# Patient Record
Sex: Female | Born: 1945
Health system: Southern US, Community
[De-identification: ages and names within clinical notes are randomized; demographics above are authoritative.]

## PROBLEM LIST (undated history)

## (undated) DIAGNOSIS — E079 Disorder of thyroid, unspecified: Secondary | ICD-10-CM

## (undated) HISTORY — PX: BREAST SURGERY: SHX581

## (undated) HISTORY — DX: Disorder of thyroid, unspecified: E07.9

## (undated) HISTORY — PX: BREAST EXCISIONAL BIOPSY: SUR124

## (undated) HISTORY — PX: TUBAL LIGATION: SHX77

## (undated) HISTORY — PX: OTHER SURGICAL HISTORY: SHX169

## (undated) HISTORY — PX: CATARACT EXTRACTION, BILATERAL: SHX1313

---

## 1998-06-26 ENCOUNTER — Other Ambulatory Visit: Admission: RE | Admit: 1998-06-26 | Discharge: 1998-06-26 | Payer: Self-pay | Admitting: Gynecology

## 1999-07-01 ENCOUNTER — Other Ambulatory Visit: Admission: RE | Admit: 1999-07-01 | Discharge: 1999-07-01 | Payer: Self-pay | Admitting: Gynecology

## 2000-06-25 ENCOUNTER — Other Ambulatory Visit: Admission: RE | Admit: 2000-06-25 | Discharge: 2000-06-25 | Payer: Self-pay | Admitting: Gynecology

## 2001-07-01 ENCOUNTER — Other Ambulatory Visit: Admission: RE | Admit: 2001-07-01 | Discharge: 2001-07-01 | Payer: Self-pay | Admitting: Gynecology

## 2002-07-05 ENCOUNTER — Other Ambulatory Visit: Admission: RE | Admit: 2002-07-05 | Discharge: 2002-07-05 | Payer: Self-pay | Admitting: Gynecology

## 2002-09-20 ENCOUNTER — Encounter: Admission: RE | Admit: 2002-09-20 | Discharge: 2002-09-20 | Payer: Self-pay | Admitting: Family Medicine

## 2002-09-20 ENCOUNTER — Encounter: Payer: Self-pay | Admitting: Family Medicine

## 2003-07-03 ENCOUNTER — Other Ambulatory Visit: Admission: RE | Admit: 2003-07-03 | Discharge: 2003-07-03 | Payer: Self-pay | Admitting: Gynecology

## 2004-06-28 ENCOUNTER — Ambulatory Visit: Payer: Self-pay | Admitting: Family Medicine

## 2004-07-01 ENCOUNTER — Ambulatory Visit: Payer: Self-pay | Admitting: Family Medicine

## 2004-07-04 ENCOUNTER — Other Ambulatory Visit: Admission: RE | Admit: 2004-07-04 | Discharge: 2004-07-04 | Payer: Self-pay | Admitting: Gynecology

## 2005-07-03 ENCOUNTER — Other Ambulatory Visit: Admission: RE | Admit: 2005-07-03 | Discharge: 2005-07-03 | Payer: Self-pay | Admitting: Gynecology

## 2005-07-04 ENCOUNTER — Other Ambulatory Visit: Admission: RE | Admit: 2005-07-04 | Discharge: 2005-07-04 | Payer: Self-pay | Admitting: Gynecology

## 2005-08-22 ENCOUNTER — Ambulatory Visit: Payer: Self-pay | Admitting: Family Medicine

## 2005-09-04 ENCOUNTER — Ambulatory Visit: Payer: Self-pay | Admitting: Family Medicine

## 2005-12-01 ENCOUNTER — Ambulatory Visit: Payer: Self-pay | Admitting: Internal Medicine

## 2006-03-06 ENCOUNTER — Ambulatory Visit: Payer: Self-pay | Admitting: Family Medicine

## 2006-03-09 ENCOUNTER — Encounter: Admission: RE | Admit: 2006-03-09 | Discharge: 2006-03-09 | Payer: Self-pay | Admitting: Family Medicine

## 2006-03-11 ENCOUNTER — Ambulatory Visit: Payer: Self-pay | Admitting: Family Medicine

## 2006-08-26 ENCOUNTER — Ambulatory Visit: Payer: Self-pay | Admitting: Family Medicine

## 2006-12-17 ENCOUNTER — Ambulatory Visit: Payer: Self-pay | Admitting: Family Medicine

## 2006-12-17 LAB — CONVERTED CEMR LAB
Albumin: 3.7 g/dL (ref 3.5–5.2)
Basophils Absolute: 0 10*3/uL (ref 0.0–0.1)
Bilirubin, Direct: 0.1 mg/dL (ref 0.0–0.3)
Cholesterol: 198 mg/dL (ref 0–200)
Eosinophils Absolute: 0.1 10*3/uL (ref 0.0–0.6)
Eosinophils Relative: 1 % (ref 0.0–5.0)
GFR calc Af Amer: 110 mL/min
GFR calc non Af Amer: 91 mL/min
Glucose, Bld: 98 mg/dL (ref 70–99)
HCT: 36.1 % (ref 36.0–46.0)
Lymphocytes Relative: 25.7 % (ref 12.0–46.0)
MCHC: 34.7 g/dL (ref 30.0–36.0)
MCV: 88.7 fL (ref 78.0–100.0)
Neutro Abs: 3.7 10*3/uL (ref 1.4–7.7)
Neutrophils Relative %: 63.3 % (ref 43.0–77.0)
Platelets: 272 10*3/uL (ref 150–400)
Sodium: 144 meq/L (ref 135–145)
TSH: 0.86 microintl units/mL (ref 0.35–5.50)
Total CHOL/HDL Ratio: 4.4
Triglycerides: 109 mg/dL (ref 0–149)
WBC: 5.9 10*3/uL (ref 4.5–10.5)

## 2006-12-24 ENCOUNTER — Ambulatory Visit: Payer: Self-pay | Admitting: Family Medicine

## 2007-03-25 ENCOUNTER — Telehealth: Payer: Self-pay | Admitting: Family Medicine

## 2007-04-14 DIAGNOSIS — J309 Allergic rhinitis, unspecified: Secondary | ICD-10-CM | POA: Insufficient documentation

## 2007-04-14 DIAGNOSIS — E039 Hypothyroidism, unspecified: Secondary | ICD-10-CM | POA: Insufficient documentation

## 2007-04-14 DIAGNOSIS — M810 Age-related osteoporosis without current pathological fracture: Secondary | ICD-10-CM | POA: Insufficient documentation

## 2007-10-07 ENCOUNTER — Telehealth (INDEPENDENT_AMBULATORY_CARE_PROVIDER_SITE_OTHER): Payer: Self-pay | Admitting: *Deleted

## 2008-01-13 ENCOUNTER — Encounter: Payer: Self-pay | Admitting: *Deleted

## 2008-03-03 ENCOUNTER — Telehealth: Payer: Self-pay | Admitting: Internal Medicine

## 2008-03-09 ENCOUNTER — Telehealth: Payer: Self-pay | Admitting: Internal Medicine

## 2008-03-09 ENCOUNTER — Encounter: Payer: Self-pay | Admitting: Internal Medicine

## 2008-04-07 ENCOUNTER — Ambulatory Visit: Payer: Self-pay | Admitting: Family Medicine

## 2008-04-07 LAB — CONVERTED CEMR LAB
Albumin: 3.8 g/dL (ref 3.5–5.2)
Alkaline Phosphatase: 83 units/L (ref 39–117)
BUN: 17 mg/dL (ref 6–23)
Basophils Absolute: 0 10*3/uL (ref 0.0–0.1)
Bilirubin Urine: NEGATIVE
Cholesterol: 220 mg/dL (ref 0–200)
Creatinine, Ser: 0.7 mg/dL (ref 0.4–1.2)
Direct LDL: 153.3 mg/dL
Eosinophils Absolute: 0 10*3/uL (ref 0.0–0.7)
Eosinophils Relative: 0.3 % (ref 0.0–5.0)
GFR calc Af Amer: 110 mL/min
GFR calc non Af Amer: 91 mL/min
Glucose, Urine, Semiquant: NEGATIVE
HCT: 38.6 % (ref 36.0–46.0)
Ketones, urine, test strip: NEGATIVE
MCHC: 33.8 g/dL (ref 30.0–36.0)
MCV: 92.6 fL (ref 78.0–100.0)
Monocytes Absolute: 1.1 10*3/uL — ABNORMAL HIGH (ref 0.1–1.0)
Platelets: 317 10*3/uL (ref 150–400)
Potassium: 4.3 meq/L (ref 3.5–5.1)
RDW: 12.3 % (ref 11.5–14.6)
Specific Gravity, Urine: 1.015
Triglycerides: 213 mg/dL (ref 0–149)
WBC: 10.7 10*3/uL — ABNORMAL HIGH (ref 4.5–10.5)
pH: 6

## 2008-04-14 ENCOUNTER — Ambulatory Visit: Payer: Self-pay | Admitting: Family Medicine

## 2008-04-14 DIAGNOSIS — K2100 Gastro-esophageal reflux disease with esophagitis, without bleeding: Secondary | ICD-10-CM | POA: Insufficient documentation

## 2008-08-14 ENCOUNTER — Emergency Department (HOSPITAL_COMMUNITY): Admission: EM | Admit: 2008-08-14 | Discharge: 2008-08-14 | Payer: Self-pay | Admitting: Obstetrics and Gynecology

## 2008-08-14 DIAGNOSIS — T07XXXA Unspecified multiple injuries, initial encounter: Secondary | ICD-10-CM | POA: Insufficient documentation

## 2008-08-14 IMAGING — CR DG RIBS W/ CHEST 3+V*L*
4 series · 4 of 4 positions shown · non-contrast
Comparison: None

CLINICAL DATA: Fall.  Pain.

LEFT RIBS AND CHEST - 3+ VIEW

[w chest pa]
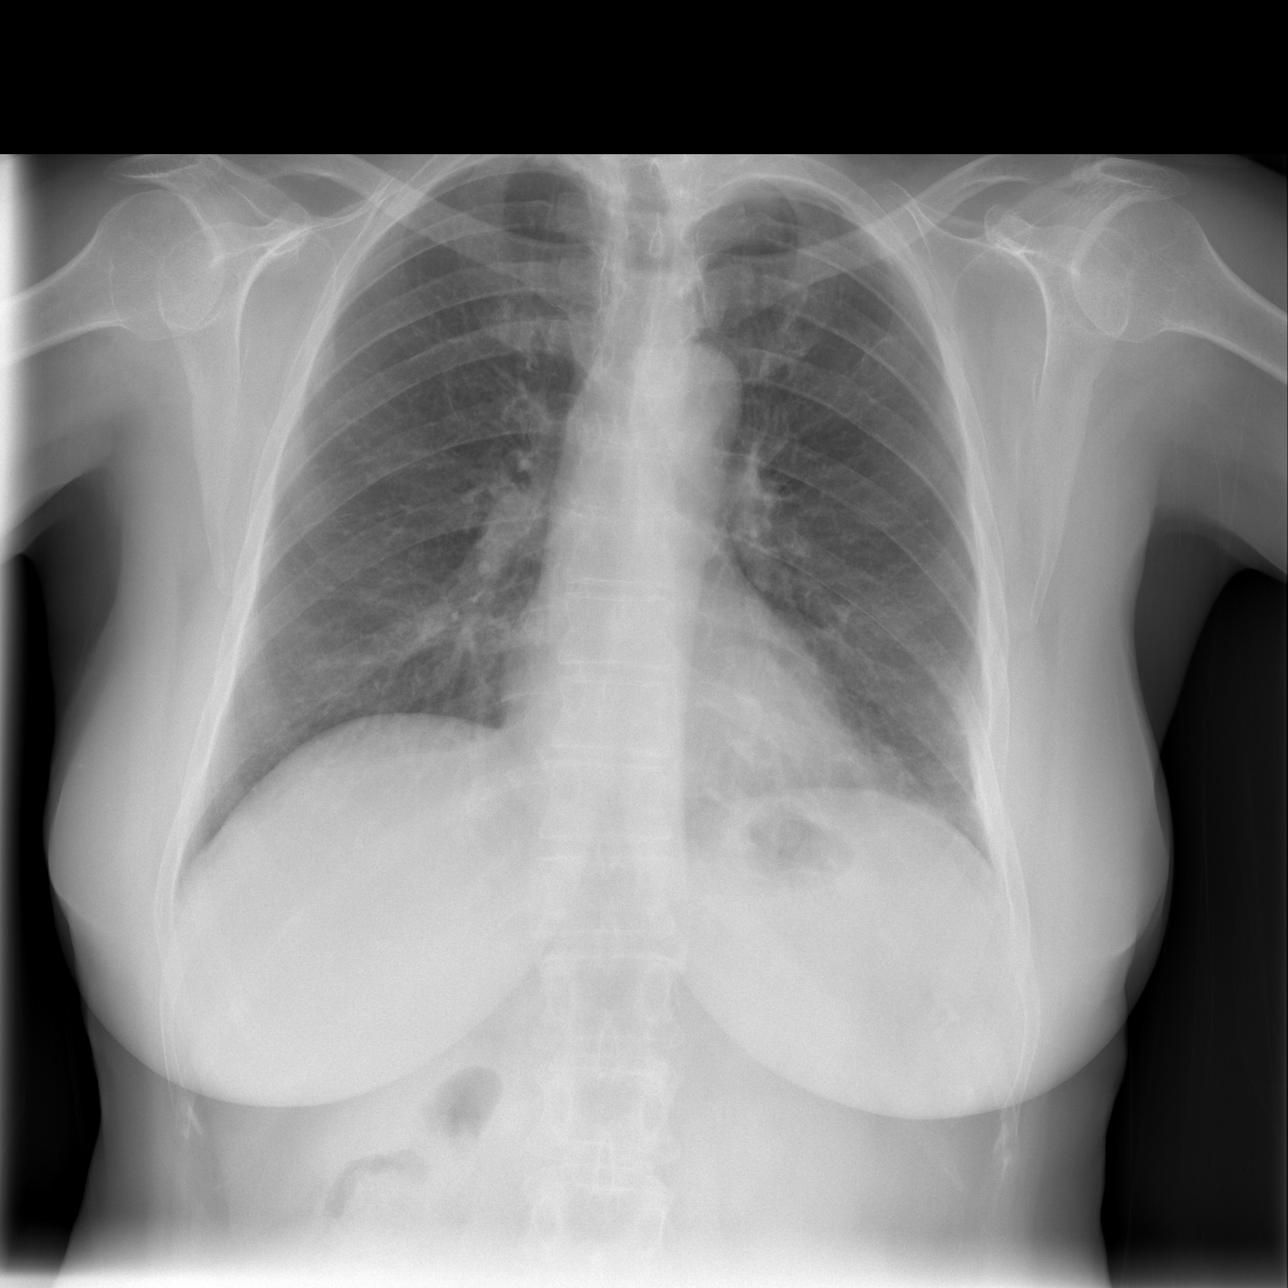

[w ribs ap/pa upper left *]
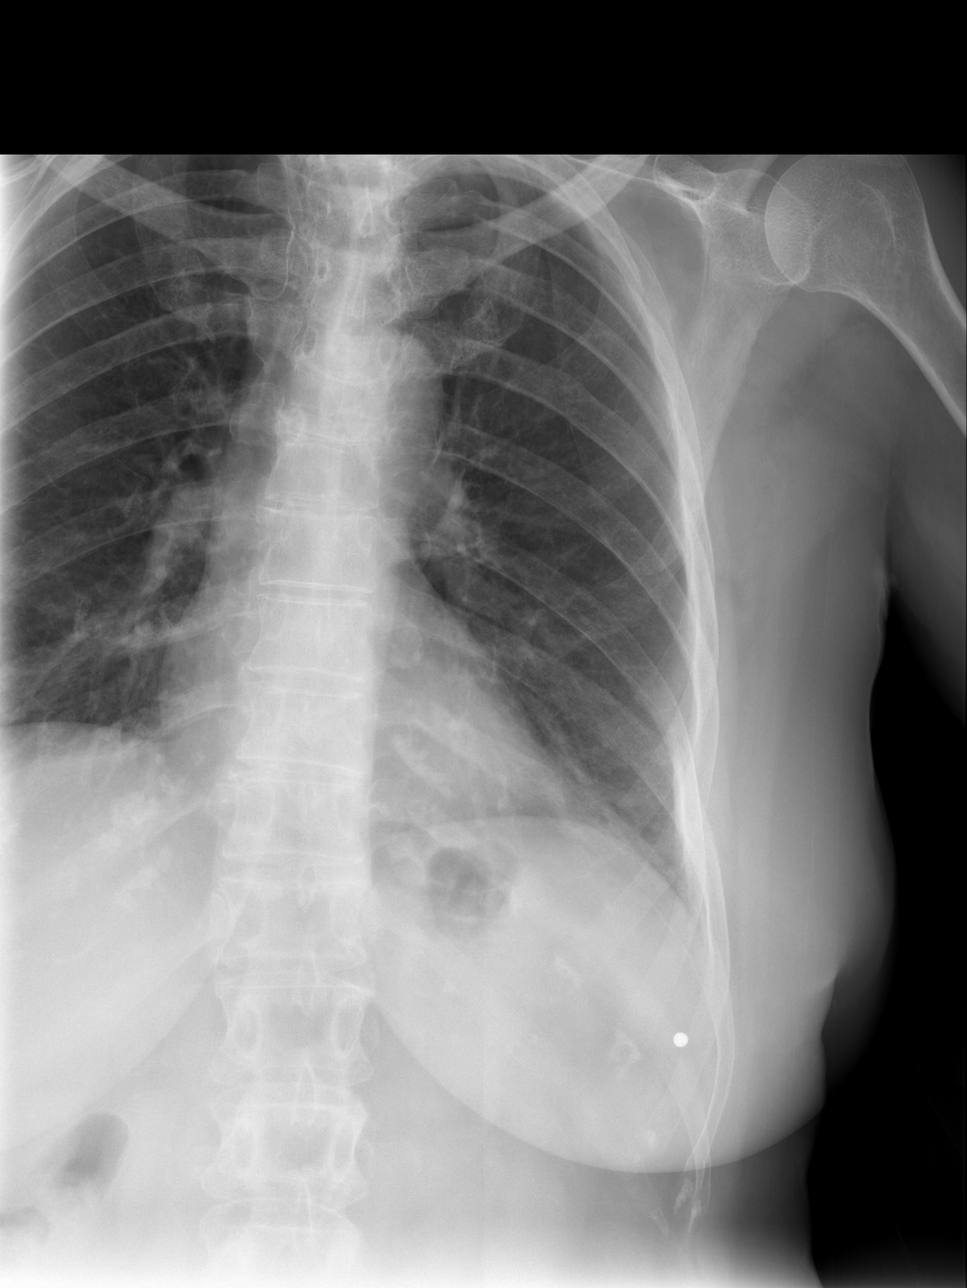

[w ribs ap/pa lower left *]
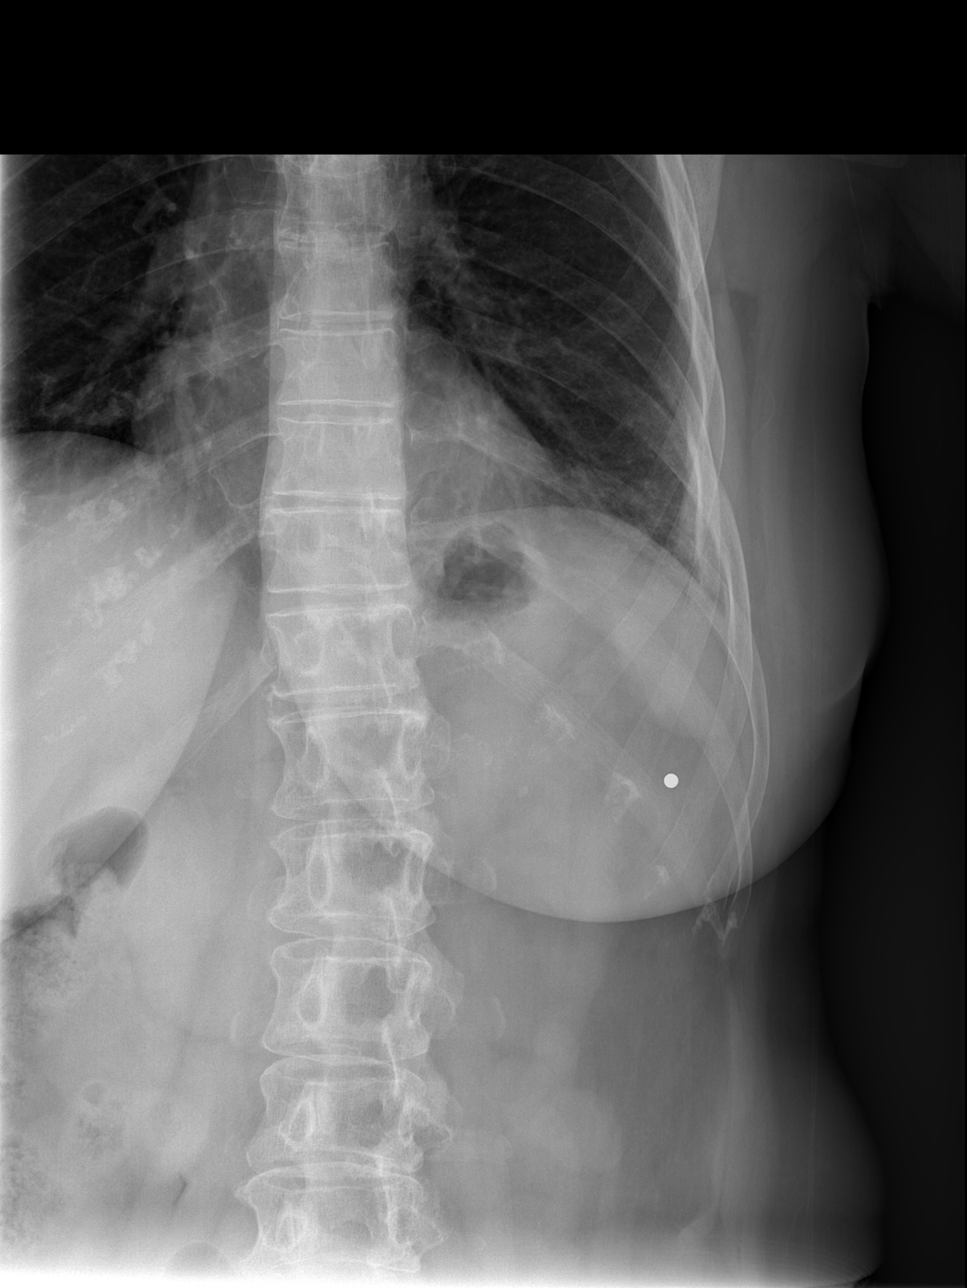

[w ribs oblique left *]
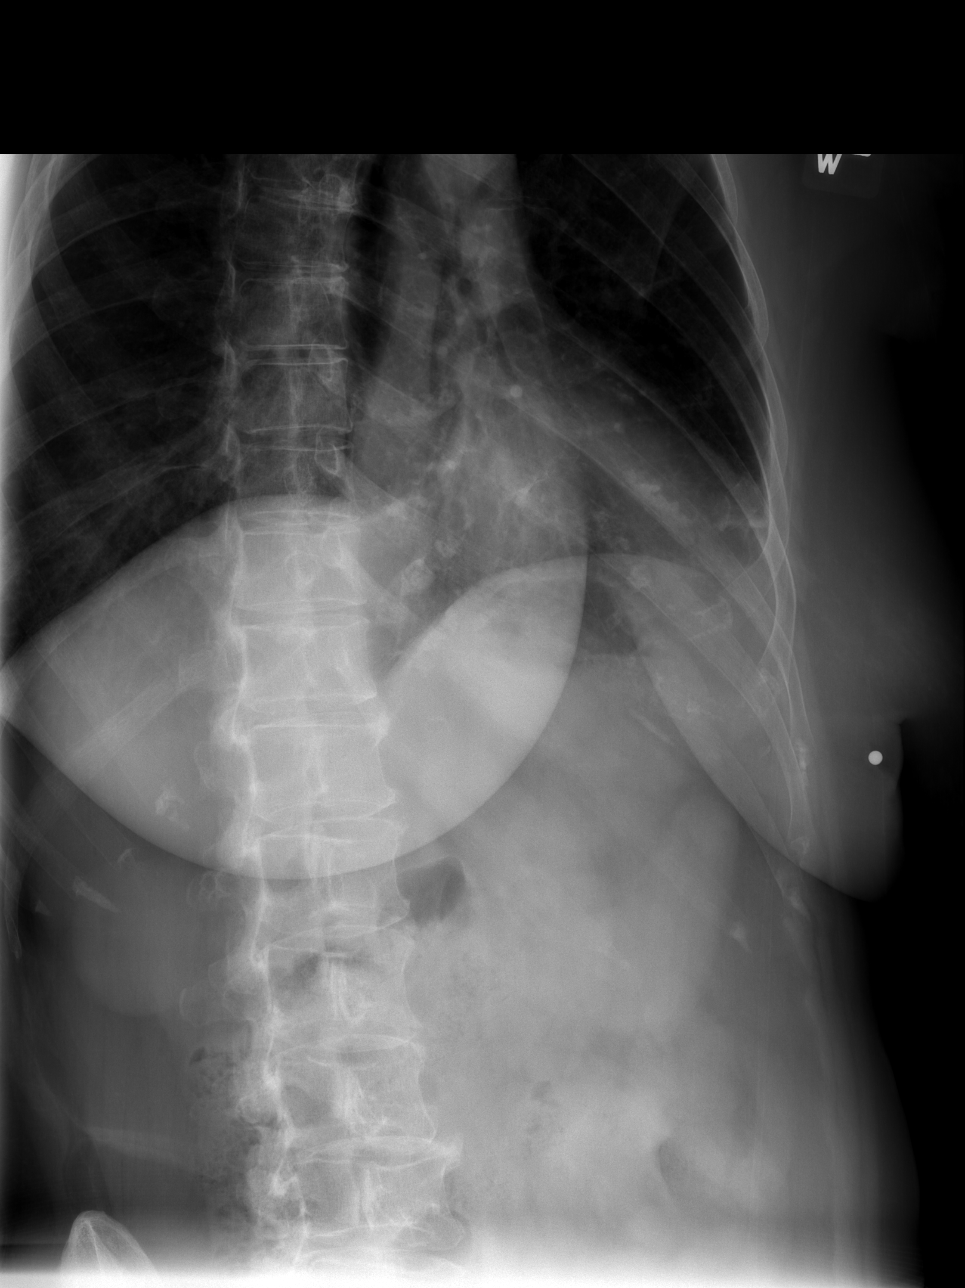

[4 of 4 positions shown; findings below may reference images not displayed]

FINDINGS: Heart size is normal.  The mediastinum is unremarkable.
There may be bronchial thickening and possibly mild atelectasis at
the left base.  Thorax or hemothorax.  No definable rib fracture.
A marker is in place over the lower left anterior chest.
IMPRESSION: No visible rib fracture.  Question mild bronchial thickening and
left base atelectasis.

## 2008-08-15 ENCOUNTER — Ambulatory Visit: Payer: Self-pay | Admitting: Family Medicine

## 2008-08-22 ENCOUNTER — Encounter: Payer: Self-pay | Admitting: Family Medicine

## 2008-08-28 ENCOUNTER — Telehealth: Payer: Self-pay | Admitting: *Deleted

## 2008-08-28 ENCOUNTER — Ambulatory Visit: Payer: Self-pay | Admitting: Family Medicine

## 2008-12-28 ENCOUNTER — Ambulatory Visit: Payer: Self-pay | Admitting: Family Medicine

## 2009-01-08 ENCOUNTER — Telehealth: Payer: Self-pay | Admitting: Family Medicine

## 2009-04-10 ENCOUNTER — Ambulatory Visit: Payer: Self-pay | Admitting: Family Medicine

## 2009-04-10 LAB — CONVERTED CEMR LAB
ALT: 20 units/L (ref 0–35)
AST: 19 units/L (ref 0–37)
Albumin: 3.8 g/dL (ref 3.5–5.2)
Alkaline Phosphatase: 82 units/L (ref 39–117)
BUN: 14 mg/dL (ref 6–23)
Basophils Absolute: 0 10*3/uL (ref 0.0–0.1)
Basophils Relative: 0.1 % (ref 0.0–3.0)
Bilirubin Urine: NEGATIVE
CO2: 29 meq/L (ref 19–32)
Chloride: 114 meq/L — ABNORMAL HIGH (ref 96–112)
Cholesterol: 218 mg/dL — ABNORMAL HIGH (ref 0–200)
Creatinine, Ser: 0.7 mg/dL (ref 0.4–1.2)
Glucose, Urine, Semiquant: NEGATIVE
Lymphs Abs: 1.2 10*3/uL (ref 0.7–4.0)
MCHC: 34.4 g/dL (ref 30.0–36.0)
MCV: 89.9 fL (ref 78.0–100.0)
Neutro Abs: 4.8 10*3/uL (ref 1.4–7.7)
Neutrophils Relative %: 73.2 % (ref 43.0–77.0)
Platelets: 287 10*3/uL (ref 150.0–400.0)
pH: 5

## 2009-04-17 ENCOUNTER — Ambulatory Visit: Payer: Self-pay | Admitting: Family Medicine

## 2010-04-12 ENCOUNTER — Ambulatory Visit: Payer: Self-pay | Admitting: Family Medicine

## 2010-04-12 LAB — CONVERTED CEMR LAB
ALT: 17 units/L (ref 0–35)
AST: 19 units/L (ref 0–37)
Alkaline Phosphatase: 85 units/L (ref 39–117)
Basophils Relative: 0.3 % (ref 0.0–3.0)
Bilirubin Urine: NEGATIVE
Bilirubin, Direct: 0.1 mg/dL (ref 0.0–0.3)
Calcium: 9.3 mg/dL (ref 8.4–10.5)
Cholesterol: 207 mg/dL — ABNORMAL HIGH (ref 0–200)
Creatinine, Ser: 0.8 mg/dL (ref 0.4–1.2)
Eosinophils Absolute: 0 10*3/uL (ref 0.0–0.7)
Eosinophils Relative: 0.6 % (ref 0.0–5.0)
GFR calc non Af Amer: 74.94 mL/min (ref >60)
Ketones, urine, test strip: NEGATIVE
Lymphocytes Relative: 18.5 % (ref 12.0–46.0)
Monocytes Relative: 9.6 % (ref 3.0–12.0)
Neutrophils Relative %: 71 % (ref 43.0–77.0)
Nitrite: NEGATIVE
Protein, U semiquant: NEGATIVE
RBC: 4.11 M/uL (ref 3.87–5.11)
Sodium: 140 meq/L (ref 135–145)
Total CHOL/HDL Ratio: 4
Total Protein: 7.6 g/dL (ref 6.0–8.3)
Triglycerides: 103 mg/dL (ref 0.0–149.0)
Urobilinogen, UA: 0.2
VLDL: 20.6 mg/dL (ref 0.0–40.0)
WBC: 7 10*3/uL (ref 4.5–10.5)

## 2010-04-19 ENCOUNTER — Ambulatory Visit: Payer: Self-pay | Admitting: Family Medicine

## 2010-08-03 ENCOUNTER — Encounter: Payer: Self-pay | Admitting: Surgery

## 2010-08-15 NOTE — Assessment & Plan Note (Signed)
Summary: cpx/njr   Vital Signs:  Patient profile:   65 year old female Menstrual status:  postmenopausal Height:      64.25 inches Weight:      140 pounds BMI:     23.93 Temp:     98.1 degrees F oral BP sitting:   120 / 80  (left arm) Cuff size:   regular  Vitals Entered By: Kern Reap CMA Duncan Dull) (April 19, 2010 9:49 AM) CC: cpx Is Patient Diabetic? No   CC:  cpx.  History of Present Illness: Natalie [name] is a delightful, 65 year old female, nonsmoker, who comes in today for general physical examination because of a history of hypothyroidism.  She retired in December of 2010 after 36 years of work.  She is enjoying her retirement.  She is walking 3 miles a day, and helping take care of her 68 year old grandson.  Review of systems negative.  She gets routine eye care, dental care, BSE monthly, annual mammography, colonoscopy, normal, tetanus, 2010, seasonal flu shot 2010 and today.  Pap smear by GYN February 2011 normal  She had a skin cancer removed from her right flank by Dr. Terri Piedra.  She sees him yearly.  Preventive Screening-Counseling & Management  Alcohol-Tobacco     Smoking Status: never  Allergies (verified): No Known Drug Allergies  Past History:  Past medical, surgical, family and social histories (including risk factors) reviewed, and no changes noted (except as noted below). Past medical, surgical, family and social histories (including risk factors) reviewed for relevance to current acute and chronic problems.  Past Medical History: Reviewed history from 04/14/2008 and no changes required. Allergic rhinitis Hypothyroidism Osteoporosis breast biopsy, right benign  Past Surgical History: CB x2 Breast Biopsy Tubal ligation Thyroid Surgery  skin cancer, right flank  Family History: Reviewed history from 04/14/2007 and no changes required. Family History of Arthritis Family History of Colon CA 1st degree relative <60 Family History Other  cancer Family History of Prostate CA 1st degree relative <50 Family History of Cardiovascular disorder  Social History: Reviewed history from 04/14/2007 and no changes required. Occupation: Married Never Smoked Alcohol use-no  Review of Systems      See HPI  Physical Exam  General:  Well-developed,well-nourished,in no acute distress; alert,appropriate and cooperative throughout examination Head:  Normocephalic and atraumatic without obvious abnormalities. No apparent alopecia or balding. Eyes:  No corneal or conjunctival inflammation noted. EOMI. Perrla. Funduscopic exam benign, without hemorrhages, exudates or papilledema. Vision grossly normal. Ears:  External ear exam shows no significant lesions or deformities.  Otoscopic examination reveals clear canals, tympanic membranes are intact bilaterally without bulging, retraction, inflammation or discharge. Hearing is grossly normal bilaterally. Nose:  External nasal examination shows no deformity or inflammation. Nasal mucosa are pink and moist without lesions or exudates. Mouth:  Oral mucosa and oropharynx without lesions or exudates.  Teeth in good repair. Neck:  No deformities, masses, or tenderness noted. Chest Wall:  No deformities, masses, or tenderness noted. Breasts:  No mass, nodules, thickening, tenderness, bulging, retraction, inflamation, nipple discharge or skin changes noted.   Lungs:  Normal respiratory effort, chest expands symmetrically. Lungs are clear to auscultation, no crackles or wheezes. Heart:  Normal rate and regular rhythm. S1 and S2 normal without gallop, murmur, click, rub or other extra sounds. Abdomen:  Bowel sounds positive,abdomen soft and non-tender without masses, organomegaly or hernias noted. Msk:  No deformity or scoliosis noted of thoracic or lumbar spine.   Pulses:  R and L carotid,radial,femoral,dorsalis pedis and  posterior tibial pulses are full and equal bilaterally Extremities:  No clubbing,  cyanosis, edema, or deformity noted with normal full range of motion of all joints.   Neurologic:  No cranial nerve deficits noted. Station and gait are normal. Plantar reflexes are down-going bilaterally. DTRs are symmetrical throughout. Sensory, motor and coordinative functions appear intact. Skin:  total body skin exam normal except for scar right breast from previous biopsy in scar right flank from previous skin cancer removal.  Otherwise, total skin exam normal Cervical Nodes:  No lymphadenopathy noted Axillary Nodes:  No palpable lymphadenopathy Inguinal Nodes:  No significant adenopathy Psych:  Cognition and judgment appear intact. Alert and cooperative with normal attention span and concentration. No apparent delusions, illusions, hallucinations   Impression & Recommendations:  Problem # 1:  ROUTINE GENERAL MEDICAL EXAM@HEALTH  CARE FACL (ICD-V70.0) Assessment Unchanged  Problem # 2:  HYPOTHYROIDISM (ICD-244.9) Assessment: Improved  Her updated medication list for this problem includes:    Synthroid 50 Mcg Tabs (Levothyroxine sodium) .Marland Kitchen... Take one tab once daily  Complete Medication List: 1)  Synthroid 50 Mcg Tabs (Levothyroxine sodium) .... Take one tab once daily  Patient Instructions: 1)  Please schedule a follow-up appointment in 1 year. 2)  It is important that you exercise regularly at least 20 minutes 5 times a week. If you develop chest pain, have severe difficulty breathing, or feel very tired , stop exercising immediately and seek medical attention. 3)  Schedule your mammogram. 4)  Schedule a colonoscopy/sigmoidoscopy to help detect colon cancer. 5)  Take calcium +Vitamin D daily. 6)  Take an Aspirin every day. Prescriptions: SYNTHROID 50 MCG TABS (LEVOTHYROXINE SODIUM) take one tab once daily  #100 x 3   Entered and Authorized by:   Roderick Pee MD   Signed by:   Roderick Pee MD on 04/19/2010   Method used:   Electronically to        [phone][facility]  (retail)       [address]       [place], [place]  [address]       Ph: [phone] or [phone]       Fax: [phone]   RxID:   [id number]

## 2010-09-24 ENCOUNTER — Ambulatory Visit: Payer: Self-pay | Admitting: Family Medicine

## 2011-04-16 ENCOUNTER — Encounter: Payer: Self-pay | Admitting: Family Medicine

## 2011-04-16 ENCOUNTER — Ambulatory Visit (INDEPENDENT_AMBULATORY_CARE_PROVIDER_SITE_OTHER): Payer: BC Managed Care – PPO | Admitting: Family Medicine

## 2011-04-16 DIAGNOSIS — B029 Zoster without complications: Secondary | ICD-10-CM

## 2011-04-16 MED ORDER — ACYCLOVIR 400 MG PO TABS: 400.0000 mg | ORAL_TABLET | Freq: Every day | ORAL | Status: AC

## 2011-04-16 NOTE — Patient Instructions (Signed)
Take the Zovirax ,,,,,,,,,,,,,5 times daily until the rash is completely gone.  If the pain gets worse, then return immediately for follow-up and reevaluation

## 2011-04-16 NOTE — Progress Notes (Signed)
  Subjective:    Patient ID: Natalie [name], female    DOB: [birth year], 65 y.o.   MRN: [id number]  HPI Natalie [name] is a 65 year old female, who comes in today for evaluation of a skin rash.  About 3 days ago she noticed a tingling sensation in her right upper chest wall area.  Then the next day broke out in a rash.  She states it looks like chickenpox.  Her pain is minimal  Review of Systems    General and dermatologic review of systems otherwise negative Objective:   Physical Exam  Well-developed well-nourished, female, in no acute distress.  Examination of the rash as a rash consistent with zoster involving the right upper chest wall     Assessment & Plan:  Zoster plan acyclovir return if pain does not resolve when the rash goes away

## 2011-04-18 ENCOUNTER — Other Ambulatory Visit (INDEPENDENT_AMBULATORY_CARE_PROVIDER_SITE_OTHER): Payer: BC Managed Care – PPO

## 2011-04-18 DIAGNOSIS — Z0000 Encounter for general adult medical examination without abnormal findings: Secondary | ICD-10-CM

## 2011-04-18 LAB — CBC WITH DIFFERENTIAL/PLATELET
Basophils Absolute: 0 10*3/uL (ref 0.0–0.1)
Eosinophils Absolute: 0 10*3/uL (ref 0.0–0.7)
Hemoglobin: 13 g/dL (ref 12.0–15.0)
Lymphocytes Relative: 18.6 % (ref 12.0–46.0)
Lymphs Abs: 1.1 10*3/uL (ref 0.7–4.0)
MCHC: 32.9 g/dL (ref 30.0–36.0)
Neutro Abs: 3.9 10*3/uL (ref 1.4–7.7)
RDW: 14.2 % (ref 11.5–14.6)

## 2011-04-18 LAB — HEPATIC FUNCTION PANEL
ALT: 23 U/L (ref 0–35)
AST: 25 U/L (ref 0–37)
Alkaline Phosphatase: 96 U/L (ref 39–117)
Bilirubin, Direct: 0 mg/dL (ref 0.0–0.3)
Total Protein: 7.9 g/dL (ref 6.0–8.3)

## 2011-04-18 LAB — BASIC METABOLIC PANEL
CO2: 25 mEq/L (ref 19–32)
Calcium: 8.9 mg/dL (ref 8.4–10.5)
Glucose, Bld: 95 mg/dL (ref 70–99)
Sodium: 140 mEq/L (ref 135–145)

## 2011-04-18 LAB — POCT URINALYSIS DIPSTICK
Ketones, UA: NEGATIVE
Protein, UA: NEGATIVE
Spec Grav, UA: 1.015
pH, UA: 6.5

## 2011-04-18 LAB — LIPID PANEL: HDL: 44.4 mg/dL (ref >39.00)

## 2011-04-24 ENCOUNTER — Ambulatory Visit (INDEPENDENT_AMBULATORY_CARE_PROVIDER_SITE_OTHER): Payer: BC Managed Care – PPO | Admitting: Family Medicine

## 2011-04-24 ENCOUNTER — Encounter: Payer: Self-pay | Admitting: Family Medicine

## 2011-04-24 VITALS — BP 118/78 | Temp 98.2°F | Ht 64.0 in | Wt 144.0 lb

## 2011-04-24 DIAGNOSIS — Z23 Encounter for immunization: Secondary | ICD-10-CM

## 2011-04-24 DIAGNOSIS — E039 Hypothyroidism, unspecified: Secondary | ICD-10-CM

## 2011-04-24 NOTE — Progress Notes (Signed)
  Subjective:    Patient ID: Natalie [name], female    DOB: 1946-02-10, 65 y.o.   MRN: [id number]  HPI Natalie [name] is a 65 year old female, nonsmoker, who comes in today for a general physical examination because of a history of hypothyroidism.  She takes Synthroid 50 mcg daily TSH level was within normal limits.  She is currently on Zovirax 400 mg 5 times daily because of her shingles involving her right anterior chest wall.  She's been on it now for 9 days and the rash is receiving pain is minimal.  She had a pelvic examination by her GYN doctor Natalie [name] this year.  She gets routine eye care, dental care, BSE monthly, annual mammography, tetanus, 2010, colonoscopy, normal,  Review of systems negative, except she had a skin cancer removed from her right hip and her dermatologist 5 years ago.  She does not recall what type.  Her social history is changed in that she is now unemployed.  Her company moved out of town.  She staying at home with her 72 year old grandson.   Review of Systems  Constitutional: Negative.   HENT: Negative.   Eyes: Negative.   Respiratory: Negative.   Cardiovascular: Negative.   Gastrointestinal: Negative.   Genitourinary: Negative.   Musculoskeletal: Negative.   Neurological: Negative.   Hematological: Negative.   Psychiatric/Behavioral: Negative.        Objective:   Physical Exam  Constitutional: She appears well-developed and well-nourished.  HENT:  Head: Normocephalic and atraumatic.  Right Ear: External ear normal.  Left Ear: External ear normal.  Nose: Nose normal.  Mouth/Throat: Oropharynx is clear and moist.  Eyes: EOM are normal. Pupils are equal, round, and reactive to light.  Neck: Normal range of motion. Neck supple. No thyromegaly present.  Cardiovascular: Normal rate, regular rhythm, normal heart sounds and intact distal pulses.  Exam reveals no gallop and no friction rub.   No murmur heard. Pulmonary/Chest: Effort normal and breath  sounds normal.  Abdominal: Soft. Bowel sounds are normal. She exhibits no distension and no mass. There is no tenderness. There is no rebound.  Genitourinary: Uterus normal.  Musculoskeletal: Normal range of motion.  Lymphadenopathy:    She has no cervical adenopathy.  Neurological: She is alert. She has normal reflexes. No cranial nerve deficit. She exhibits normal muscle tone. Coordination normal.  Skin: Skin is warm and dry.       Bilateral breast exam normal except for a scar right nipple from previous biopsy.  Total body skin exam shows no evidence of any abnormal appearing lesions  Psychiatric: She has a normal mood and affect. Her behavior is normal. Judgment and thought content normal.          Assessment & Plan:  Healthy female.  History of hypothyroidism.  Continue Synthroid 50 mcg daily.  Return in one year, sooner if any problems

## 2011-04-24 NOTE — Patient Instructions (Signed)
Taking another round of the Zovirax.  Continue current medications.  Follow-up in one year for annual exam,,,,, sooner if any problem

## 2011-06-24 ENCOUNTER — Other Ambulatory Visit: Payer: Self-pay | Admitting: Family Medicine

## 2011-08-11 ENCOUNTER — Telehealth: Payer: Self-pay | Admitting: Family Medicine

## 2011-08-11 NOTE — Telephone Encounter (Signed)
Pt's allergies are really flaring. Has already tried OTC Claritin and nasal saline spray. Can something stronger be called in? Please call pt to advise. Thanks! [facility] [address]

## 2011-08-12 NOTE — Telephone Encounter (Signed)
Prednisone 20 mg dispense 30 tabs directions 1 tab x5 days, I have tabs x5 days, then stop

## 2011-08-13 MED ORDER — PREDNISONE 20 MG PO TABS: ORAL_TABLET | ORAL | Status: DC

## 2011-08-13 NOTE — Telephone Encounter (Signed)
rx sent

## 2011-10-27 ENCOUNTER — Encounter: Payer: Self-pay | Admitting: Gastroenterology

## 2011-11-03 ENCOUNTER — Encounter: Payer: Self-pay | Admitting: Gastroenterology

## 2011-11-26 ENCOUNTER — Ambulatory Visit (AMBULATORY_SURGERY_CENTER): Payer: BC Managed Care – PPO | Admitting: *Deleted

## 2011-11-26 ENCOUNTER — Encounter: Payer: Self-pay | Admitting: Gastroenterology

## 2011-11-26 VITALS — Ht 64.0 in | Wt 146.9 lb

## 2011-11-26 DIAGNOSIS — Z1211 Encounter for screening for malignant neoplasm of colon: Secondary | ICD-10-CM

## 2011-11-26 MED ORDER — PEG-KCL-NACL-NASULF-NA ASC-C 100 G PO SOLR: ORAL | Status: DC

## 2011-12-10 ENCOUNTER — Other Ambulatory Visit (INDEPENDENT_AMBULATORY_CARE_PROVIDER_SITE_OTHER): Payer: BC Managed Care – PPO

## 2011-12-10 ENCOUNTER — Other Ambulatory Visit: Payer: Self-pay | Admitting: Gastroenterology

## 2011-12-10 ENCOUNTER — Encounter: Payer: Self-pay | Admitting: Gastroenterology

## 2011-12-10 ENCOUNTER — Encounter: Payer: BC Managed Care – PPO | Admitting: Gastroenterology

## 2011-12-10 ENCOUNTER — Ambulatory Visit (AMBULATORY_SURGERY_CENTER): Payer: BC Managed Care – PPO | Admitting: Gastroenterology

## 2011-12-10 VITALS — BP 158/96 | HR 116 | Temp 96.2°F | Resp 18 | Ht 64.0 in | Wt 146.0 lb

## 2011-12-10 DIAGNOSIS — K5289 Other specified noninfective gastroenteritis and colitis: Secondary | ICD-10-CM

## 2011-12-10 DIAGNOSIS — Z1211 Encounter for screening for malignant neoplasm of colon: Secondary | ICD-10-CM

## 2011-12-10 DIAGNOSIS — K529 Noninfective gastroenteritis and colitis, unspecified: Secondary | ICD-10-CM

## 2011-12-10 DIAGNOSIS — K559 Vascular disorder of intestine, unspecified: Secondary | ICD-10-CM

## 2011-12-10 DIAGNOSIS — R000 Tachycardia, unspecified: Secondary | ICD-10-CM

## 2011-12-10 LAB — CBC WITH DIFFERENTIAL/PLATELET
Basophils Absolute: 0 10*3/uL (ref 0.0–0.1)
Eosinophils Absolute: 0 10*3/uL (ref 0.0–0.7)
HCT: 40.5 % (ref 36.0–46.0)
Lymphs Abs: 1.7 10*3/uL (ref 0.7–4.0)
MCHC: 33.2 g/dL (ref 30.0–36.0)
Monocytes Relative: 8 % (ref 3.0–12.0)
Platelets: 287 10*3/uL (ref 150.0–400.0)
RDW: 13.1 % (ref 11.5–14.6)

## 2011-12-10 LAB — HEPATIC FUNCTION PANEL
AST: 25 U/L (ref 0–37)
Albumin: 4.2 g/dL (ref 3.5–5.2)
Alkaline Phosphatase: 84 U/L (ref 39–117)
Total Protein: 7.8 g/dL (ref 6.0–8.3)

## 2011-12-10 LAB — BASIC METABOLIC PANEL
BUN: 9 mg/dL (ref 6–23)
Chloride: 109 mEq/L (ref 96–112)
Glucose, Bld: 97 mg/dL (ref 70–99)
Potassium: 4.1 mEq/L (ref 3.5–5.1)

## 2011-12-10 LAB — TSH: TSH: 0.83 u[IU]/mL (ref 0.35–5.50)

## 2011-12-10 MED ORDER — SODIUM CHLORIDE 0.9 % IV SOLN: 500.0000 mL | INTRAVENOUS | Status: DC

## 2011-12-10 NOTE — Patient Instructions (Addendum)
AVOID ALL NSAIDS (IBUPROFEN, MOTRIN,ALEVE,ADVIL)  FOR THE NEXT TWO WEEKS.    GO TO [facility] TODAY FOR LABWORK.  APPOINTMENT WITH DR. Delman Kitten, CARDIOLOGY.     Friday,MAY 31,2013      ARRIVE AT 4:00 PM FOR 4:15 APPOINTMENT.  [address]  [phone]       YOU HAD AN ENDOSCOPIC PROCEDURE TODAY AT THE [facility]: Refer to the procedure report that was given to you for any specific questions about what was found during the examination.  If the procedure report does not answer your questions, please call your gastroenterologist to clarify.  If you requested that your care partner not be given the details of your procedure findings, then the procedure report has been included in a sealed envelope for you to review at your convenience later.  YOU SHOULD EXPECT: Some feelings of bloating in the abdomen. Passage of more gas than usual.  Walking can help get rid of the air that was put into your GI tract during the procedure and reduce the bloating. If you had a lower endoscopy (such as a colonoscopy or flexible sigmoidoscopy) you may notice spotting of blood in your stool or on the toilet paper. If you underwent a bowel prep for your procedure, then you may not have a normal bowel movement for a few days.  DIET: Your first meal following the procedure should be a light meal and then it is ok to progress to your normal diet.  A half-sandwich or bowl of soup is an example of a good first meal.  Heavy or fried foods are harder to digest and may make you feel nauseous or bloated.  Likewise meals heavy in dairy and vegetables can cause extra gas to form and this can also increase the bloating.  Drink plenty of fluids but you should avoid alcoholic beverages for 24 hours.  ACTIVITY: Your care partner should take you home directly after the procedure.  You should plan to take it easy, moving slowly for the rest of the day.  You can resume normal activity the day  after the procedure however you should NOT DRIVE or use heavy machinery for 24 hours (because of the sedation medicines used during the test).    SYMPTOMS TO REPORT IMMEDIATELY: A gastroenterologist can be reached at any hour.  During normal business hours, 8:30 AM to 5:00 PM Monday through Friday, call [phone].  After hours and on weekends, please call the GI answering service at [phone] who will take a message and have the physician on call contact you.   Following lower endoscopy (colonoscopy or flexible sigmoidoscopy):  Excessive amounts of blood in the stool  Significant tenderness or worsening of abdominal pains  Swelling of the abdomen that is new, acute  Fever of 100F or higher  Following upper endoscopy (EGD)  Vomiting of blood or coffee ground material  New chest pain or pain under the shoulder blades  Painful or persistently difficult swallowing  New shortness of breath  Fever of 100F or higher  Black, tarry-looking stools  FOLLOW UP: If any biopsies were taken you will be contacted by phone or by letter within the next 1-3 weeks.  Call your gastroenterologist if you have not heard about the biopsies in 3 weeks.  Our staff will call the home number listed on your records the next business day following your procedure to check on you and address any questions or concerns that you may have at  that time regarding the information given to you following your procedure. This is a courtesy call and so if there is no answer at the home number and we have not heard from you through the emergency physician on call, we will assume that you have returned to your regular daily activities without incident.  SIGNATURES/CONFIDENTIALITY: You and/or your care partner have signed paperwork which will be entered into your electronic medical record.  These signatures attest to the fact that that the information above on your After Visit Summary has been reviewed and is understood.   Full responsibility of the confidentiality of this discharge information lies with you and/or your care-partner.

## 2011-12-10 NOTE — Progress Notes (Signed)
PATIENT IN SINUS TACH ON ADMISSION TO RECOVERY 120-130S. DENIES CHEST PAIN OR SHORTNESS OF BREATH. SKIN WARN DRY AND PINK. ANXIOUS. 500 ML NS ADDED.ENCOURAGED PATIENT THAT SHE WAS DOING WELL AND PROCEDURE WENT WELL. DR. Jarold Motto IN TO SEE PATIENT, HEART RATE UP TO 160'S. CONTINUES TO DENY ANY PROBLEMS. LABWORK AND CARDIOLOGY CONSULT ORDERED. RHYTHM CONVERTED TO NSR AT 1505. PATIENT CALM.

## 2011-12-10 NOTE — Progress Notes (Signed)
PATIENT CALM, DENIES SHORTNESS OF BREATH, CHEST PAIN OR ANY PROBLEMS. PATIENT DISCHARGED  AND TAKEN TO LAB.

## 2011-12-10 NOTE — Op Note (Signed)
[facility] [address] [place], [place]  [address]  COLONOSCOPY PROCEDURE REPORT  PATIENT:  Natalie, [name]  MR#:  [id number] BIRTHDATE:  08-Jul-1946, 66 yrs. old  GENDER:  female ENDOSCOPIST:  Natalie Rea. Jarold Motto, MD, [facility] REF. BY: PROCEDURE DATE:  12/10/2011 PROCEDURE:  Colonoscopy with biopsy ASA CLASS:  Class II INDICATIONS:  Routine Risk Screening LAST EXAM 2003. MEDICATIONS:   propofol (Diprivan) 200 mg IV  DESCRIPTION OF PROCEDURE:   After the risks and benefits and of the procedure were explained, informed consent was obtained. Digital rectal exam was performed and revealed no abnormalities. The LB [id number] endoscope was introduced through the anus and advanced to the cecum, which was identified by both the appendix and ileocecal valve.  The quality of the prep was excellent, using MoviPrep.  The instrument was then slowly withdrawn as the colon was fully examined. <<PROCEDUREIMAGES>>  FINDINGS:  Colitis was found. SCATTERED AREAS OF SUBMUCOSAL HEMORRHARE AND FRIALILITY ABOVE THE SIGMOID AREA TO THE CECUM.SEE PICTURES.BIOPSIES DONE.   Retroflexed views in the rectum revealed no abnormalities.    The scope was then withdrawn from the patient and the procedure completed.  COMPLICATIONS:  None ENDOSCOPIC IMPRESSION: 1) Colitis 2) No polyps or cancers ?? ISCHEMIC COLITIS FROM PERP,SHE HAS NO GI SYMPTOMS.SHE HAS A TACYCARDIA 120-130. RECOMMENDATIONS: 1) Avoid all NSAIDS for the next 2 weeks. 2) Await biopsy results PRIMARY CARE F/U ASAP.DR.TODD.  REPEAT EXAM:  No  ______________________________ Natalie Rea. Jarold Motto, MD, Natalie [name]  CC:  Natalie Pee, MD  n. Natalie [name]:   Natalie Rea. Natalie [name] at 12/10/2011 02:51 PM  [name], Natalie Cella, [phone]

## 2011-12-10 NOTE — Progress Notes (Signed)
Patient did not experience any of the following events: a burn prior to discharge; a fall within the facility; wrong site/side/patient/procedure/implant event; or a hospital transfer or hospital admission upon discharge from the facility. (G8907) Patient did not have preoperative order for IV antibiotic SSI prophylaxis. (G8918)  

## 2011-12-11 ENCOUNTER — Telehealth: Payer: Self-pay | Admitting: *Deleted

## 2011-12-11 LAB — T3: T3, Total: 124 ng/dL (ref 80.0–204.0)

## 2011-12-11 NOTE — Telephone Encounter (Signed)
Left message on f/u callback 

## 2011-12-12 ENCOUNTER — Encounter: Payer: Self-pay | Admitting: Cardiology

## 2011-12-12 ENCOUNTER — Ambulatory Visit (INDEPENDENT_AMBULATORY_CARE_PROVIDER_SITE_OTHER): Payer: BC Managed Care – PPO | Admitting: Cardiology

## 2011-12-12 VITALS — BP 140/100 | HR 91 | Ht 64.0 in | Wt 143.1 lb

## 2011-12-12 DIAGNOSIS — F411 Generalized anxiety disorder: Secondary | ICD-10-CM

## 2011-12-12 DIAGNOSIS — R0609 Other forms of dyspnea: Secondary | ICD-10-CM

## 2011-12-12 DIAGNOSIS — R0683 Snoring: Secondary | ICD-10-CM | POA: Insufficient documentation

## 2011-12-12 DIAGNOSIS — F419 Anxiety disorder, unspecified: Secondary | ICD-10-CM | POA: Insufficient documentation

## 2011-12-12 DIAGNOSIS — R000 Tachycardia, unspecified: Secondary | ICD-10-CM

## 2011-12-12 NOTE — Assessment & Plan Note (Addendum)
This sounds like sinus tachycardia. She has no symptoms related. I taught her how to take her heart rate and told her what signs and symptoms to look for should she have any tachydysrhythmias in the future. She's had a complete lab work up and I reviewed these labs. She had normal TSH.  No further change in therapy is indicated.

## 2011-12-12 NOTE — Assessment & Plan Note (Signed)
I asked her to discuss this with Dr. Tawanna Cooler as she is bothered by this.

## 2011-12-12 NOTE — Progress Notes (Signed)
   HPI The patient has no prior cardiac history. However, recently she had a colonoscopy. Post procedure in the recovery area she's had a heart rate. It was recorded as 160 beats per minute. I have strips demonstrating rates of about 130. He was said to have "broken". However, I see only sinus tachycardia which slowly improved. She doesn't recall any of this. She has no prior cardiac history or workup. She walks 3 miles every day. She denies any chest pressure, neck or arm discomfort. She doesn't notice palpitations, presyncope or syncope. She doesn't have PND or orthopnea. She's had no weight gain or edema. She does think she's had increased stress and anxiety. She also questions whether she could have sleep apnea she snores.  No Known Allergies  Current Outpatient Prescriptions  Medication Sig Dispense Refill  . SYNTHROID 50 MCG tablet TAKE 1 TABLET EVERY DAY  100 tablet  3    Past Medical History  Diagnosis Date  . Allergy   . Thyroid disease   . OP (osteoporosis)     Past Surgical History  Procedure Date  . Breast surgery     right  . Tubal ligation   . Thyroid     Family History  Problem Relation Age of Onset  . Arthritis Other   . Cancer Other     colon, prostate  . Heart disease Other   . Colon polyps Father 49  . Stomach cancer Neg Hx     History   Social History  . Marital Status: Legally Separated    Spouse Name: N/A    Number of Children: N/A  . Years of Education: N/A   Occupational History  . Not on file.   Social History Main Topics  . Smoking status: Never Smoker   . Smokeless tobacco: Never Used  . Alcohol Use: No  . Drug Use: Not on file  . Sexually Active: Not on file   Other Topics Concern  . Not on file   Social History Narrative  . No narrative on file    ROS:  PHYSICAL EXAM There were no vitals taken for this visit. GENERAL:  Well appearing HEENT:  Pupils equal round and reactive, fundi not visualized, oral mucosa  unremarkable NECK:  No jugular venous distention, waveform within normal limits, carotid upstroke brisk and symmetric, no bruits, no thyromegaly LYMPHATICS:  No cervical, inguinal adenopathy LUNGS:  Clear to auscultation bilaterally BACK:  No CVA tenderness CHEST:  Unremarkable HEART:  PMI not displaced or sustained,S1 and S2 within normal limits, no S3, no S4, no clicks, no rubs, no murmurs ABD:  Flat, positive bowel sounds normal in frequency in pitch, no bruits, no rebound, no guarding, no midline pulsatile mass, no hepatomegaly, no splenomegaly EXT:  2 plus pulses throughout, no edema, no cyanosis no clubbing SKIN:  No rashes no nodules NEURO:  Cranial nerves II through XII grossly intact, motor grossly intact throughout PSYCH:  Cognitively intact, oriented to person place and time  EKG:  Sinus rhythm, rate 91, axis within normal limits, intervals within normal limits, RSR prime V1 and V2, no acute ST-T wave changes.  ASSESSMENT AND PLAN

## 2011-12-12 NOTE — Assessment & Plan Note (Signed)
We discussed the possibility of a sleep study but she wants to wait on this.

## 2011-12-17 ENCOUNTER — Ambulatory Visit (INDEPENDENT_AMBULATORY_CARE_PROVIDER_SITE_OTHER): Payer: BC Managed Care – PPO | Admitting: Family Medicine

## 2011-12-17 ENCOUNTER — Encounter: Payer: Self-pay | Admitting: Family Medicine

## 2011-12-17 VITALS — BP 150/90 | Temp 98.1°F | Wt 146.0 lb

## 2011-12-17 DIAGNOSIS — I499 Cardiac arrhythmia, unspecified: Secondary | ICD-10-CM | POA: Insufficient documentation

## 2011-12-17 DIAGNOSIS — IMO0001 Reserved for inherently not codable concepts without codable children: Secondary | ICD-10-CM

## 2011-12-17 DIAGNOSIS — R030 Elevated blood-pressure reading, without diagnosis of hypertension: Secondary | ICD-10-CM

## 2011-12-17 NOTE — Progress Notes (Signed)
  Subjective:    Patient ID: Natalie [name], female    DOB: [birth year], 66 y.o.   MRN: [id number]  HPI Salvador is a 66 year old female nonsmoker,,,,,,,,, separated,,,,,,,, who comes in today for evaluation of elevated blood pressure and irregular heart rate  She states she had a colonoscopy by Dr. Christella Hartigan and she developed a rapid heart rate. She was monitored during the procedure and then referred to Dr. Leafy Half. in cardiology who did a evaluation including an EKG which was normal.  Her daughter who is a CNA has been checking her blood pressure at home and it varies from normal to elevated. BP today here 150/90 right arm sitting position. She also has noted that her heart has been skipping no lightheadedness syncope etc.  She's a nonsmoker minimal caffeine consumption,,,,,,, 2 diet Cokes a day  Family history negative for cardiac disease hypertension Review of Systems    cardiopulmonary review of systems negative Objective:   Physical Exam Well-developed well-nourished female in no acute distress lungs are normal BP right arm sitting position 150/90 pulse was irregular with episodes of rapid rate. By the time we got the EKG machine on her shoes back and regular rhythm heart rate dropped back in the 70 and regular range. We tried a lead 2 rhythm strip but again that showed no abnormality       Assessment & Plan:  Elevated blood pressure  Episodes of irregular rapid heart rate plan Holter monitor and blood pressure monitoring at home followup a week

## 2011-12-17 NOTE — Patient Instructions (Signed)
Stop all caffeinated beverages  Purchase a digital pump up blood pressure cuff and check your blood pressure 3 times daily  Normal exercise  Avoid salt and  We will get you set up for a 24-hour Holter monitor to try to determine etiology of your episodes of rapid heart rate  Return 3-5 days after your monitor is removed for followup. When you return bring a record of all your blood pressure readings and the device

## 2011-12-30 ENCOUNTER — Encounter (INDEPENDENT_AMBULATORY_CARE_PROVIDER_SITE_OTHER): Payer: BC Managed Care – PPO

## 2011-12-30 DIAGNOSIS — R002 Palpitations: Secondary | ICD-10-CM

## 2012-01-08 ENCOUNTER — Ambulatory Visit (INDEPENDENT_AMBULATORY_CARE_PROVIDER_SITE_OTHER): Payer: BC Managed Care – PPO | Admitting: Family Medicine

## 2012-01-08 ENCOUNTER — Encounter: Payer: Self-pay | Admitting: Family Medicine

## 2012-01-08 VITALS — BP 140/90 | Temp 98.1°F | Wt 146.0 lb

## 2012-01-08 DIAGNOSIS — R000 Tachycardia, unspecified: Secondary | ICD-10-CM

## 2012-01-08 NOTE — Progress Notes (Signed)
  Subjective:    Patient ID: Natalie [name], female    DOB: 06-08-1946, 66 y.o.   MRN: [id number]  HPI Natalie [name] is a 66 year old female who comes in today for followup of rapid heart rate  Week set her up for a 24-hour Holter monitor because of episodes of rapid heart rate no chest pain or shortness of breath. Monitor shows a combination of occasional PVCs PACs and a very occasional short burst of PAT  We sat down and explained to her in detail what was causing this problem. She's having decreased symptoms now that she has been tapering off her caffeine. She's tried to stop the caffeine completely but he gave her a caffeine withdrawal headache   Review of Systems    cardiovascular review of systems negative she walks 3 miles a day with no chest pain nor shortness of breath Objective:   Physical Exam Well-developed well-nourished female in no acute distress examination of heart is normal       Assessment & Plan:  Cardiac arrhythmia benign offered treatment options patient inclined to stay off caffeine do her exercise and not take any medication at this juncture

## 2012-01-08 NOTE — Patient Instructions (Signed)
Continue to taper off the caffeine  Call us if you  decide you want to take medication

## 2012-01-19 ENCOUNTER — Telehealth: Payer: Self-pay | Admitting: Gastroenterology

## 2012-01-19 DIAGNOSIS — R109 Unspecified abdominal pain: Secondary | ICD-10-CM

## 2012-01-19 NOTE — Telephone Encounter (Signed)
Dr Jarold Motto, can you please review the path from 12/10/11 procedure; I do not know whether it's OK or not. Pt denies any problems except her stomach hurts above her waist on the left side every now and then. Thanks.

## 2012-01-19 NOTE — Telephone Encounter (Addendum)
NEEDS CT SCAN ABD/PELVIS.Marland Kitchen?? ISCHEMIC COLITIS

## 2012-01-20 NOTE — Telephone Encounter (Signed)
Informed pt she needs a CT scan. Pt was concerned with the cost and Morrie Sheldon checked her deductible and pt agreed to have the CT done. Pt will pick up the contrast for 01/22/12, 0900 scan. She will drink one bottle of contrast at 7am and the second at 8am; NPO after midnight. Pt stated understanding.

## 2012-01-22 ENCOUNTER — Ambulatory Visit (INDEPENDENT_AMBULATORY_CARE_PROVIDER_SITE_OTHER)
Admission: RE | Admit: 2012-01-22 | Discharge: 2012-01-22 | Disposition: A | Discharge status: Home / Self Care | Payer: BC Managed Care – PPO | Source: Ambulatory Visit | Attending: Gastroenterology | Admitting: Gastroenterology

## 2012-01-22 DIAGNOSIS — R109 Unspecified abdominal pain: Secondary | ICD-10-CM

## 2012-01-22 MED ORDER — IOHEXOL 300 MG/ML  SOLN
100.0000 mL | Freq: Once | INTRAMUSCULAR | Status: AC | PRN
  Administered 2012-01-22: 100 mL via INTRAVENOUS

## 2012-04-06 ENCOUNTER — Encounter: Payer: Self-pay | Admitting: Family Medicine

## 2012-04-06 ENCOUNTER — Ambulatory Visit (INDEPENDENT_AMBULATORY_CARE_PROVIDER_SITE_OTHER): Payer: BC Managed Care – PPO | Admitting: Family Medicine

## 2012-04-06 VITALS — BP 140/98 | Temp 98.4°F | Wt 147.0 lb

## 2012-04-06 DIAGNOSIS — R000 Tachycardia, unspecified: Secondary | ICD-10-CM

## 2012-04-06 DIAGNOSIS — I499 Cardiac arrhythmia, unspecified: Secondary | ICD-10-CM

## 2012-04-06 LAB — POCT URINALYSIS DIPSTICK
Glucose, UA: NEGATIVE
Leukocytes, UA: NEGATIVE
Nitrite, UA: NEGATIVE
Spec Grav, UA: <=1.005
Urobilinogen, UA: 0.2

## 2012-04-06 LAB — CBC WITH DIFFERENTIAL/PLATELET
Basophils Absolute: 0 10*3/uL (ref 0.0–0.1)
HCT: 42.9 % (ref 36.0–46.0)
Lymphs Abs: 1.8 10*3/uL (ref 0.7–4.0)
MCV: 91.7 fl (ref 78.0–100.0)
Monocytes Absolute: 0.8 10*3/uL (ref 0.1–1.0)
Monocytes Relative: 8.8 % (ref 3.0–12.0)
Neutrophils Relative %: 69.4 % (ref 43.0–77.0)
Platelets: 303 10*3/uL (ref 150.0–400.0)
RDW: 13.2 % (ref 11.5–14.6)
WBC: 8.6 10*3/uL (ref 4.5–10.5)

## 2012-04-06 MED ORDER — METOPROLOL SUCCINATE ER 25 MG PO TB24: 25.0000 mg | ORAL_TABLET | Freq: Every day | ORAL | Status: DC

## 2012-04-06 NOTE — Progress Notes (Signed)
  Subjective:    Patient ID: Natalie [name], female    DOB: 12/30/1945, 66 y.o.   MRN: [id number]  HPI [place] is a 66 year old female nonsmoker who comes in today with a five-day history of rapid heart rate  She had an episode like this in June at that time her EKG by the time she got in here was normal. Holter monitor showed PACs PVCs and a couple episodes of PAT. Cardiac evaluation no medication was recommended unless he gets worse more frequent or more severe.  This time it's been going on for 5 days.   Review of Systems General and cardiac review of systems otherwise negative    Objective:   Physical Exam Well-developed and nourished female no acute distress cardiac exam shows a rapid heart rate around 120 no murmur. 10 mg of beta blocker was given and she was monitored every 15 minutes       Assessment & Plan:  Recurrent episodes of PAT start beta blocker daily followup in one month

## 2012-04-06 NOTE — Patient Instructions (Addendum)
Avoid all caffeinated beverages  Toprol 25 mg one tablet daily  Return in 4 weeks for followup

## 2012-04-07 LAB — BASIC METABOLIC PANEL
BUN: 14 mg/dL (ref 6–23)
Creatinine, Ser: 0.8 mg/dL (ref 0.4–1.2)
GFR: 78.89 mL/min (ref >60.00)
Glucose, Bld: 89 mg/dL (ref 70–99)
Potassium: 4.8 mEq/L (ref 3.5–5.1)

## 2012-04-20 ENCOUNTER — Encounter: Payer: Self-pay | Admitting: Cardiology

## 2012-04-20 ENCOUNTER — Ambulatory Visit (INDEPENDENT_AMBULATORY_CARE_PROVIDER_SITE_OTHER): Payer: BC Managed Care – PPO | Admitting: Cardiology

## 2012-04-20 VITALS — BP 155/91 | HR 88 | Ht 64.0 in | Wt 150.8 lb

## 2012-04-20 DIAGNOSIS — IMO0001 Reserved for inherently not codable concepts without codable children: Secondary | ICD-10-CM

## 2012-04-20 DIAGNOSIS — R0683 Snoring: Secondary | ICD-10-CM

## 2012-04-20 DIAGNOSIS — R000 Tachycardia, unspecified: Secondary | ICD-10-CM

## 2012-04-20 DIAGNOSIS — R0609 Other forms of dyspnea: Secondary | ICD-10-CM

## 2012-04-20 DIAGNOSIS — R030 Elevated blood-pressure reading, without diagnosis of hypertension: Secondary | ICD-10-CM

## 2012-04-20 MED ORDER — METOPROLOL SUCCINATE ER 50 MG PO TB24: 50.0000 mg | ORAL_TABLET | Freq: Every day | ORAL | Status: DC

## 2012-04-20 NOTE — Patient Instructions (Addendum)
Please increase your Metoprolol to 50 mg a day.  Continue all other medications as listed.  Your physician has requested that you have an echocardiogram. Echocardiography is a painless test that uses sound waves to create images of your heart. It provides your doctor with information about the size and shape of your heart and how well your heart's chambers and valves are working. This procedure takes approximately one hour. There are no restrictions for this procedure.  Your physician has recommended that you have a sleep study. This test records several body functions during sleep, including: brain activity, eye movement, oxygen and carbon dioxide blood levels, heart rate and rhythm, breathing rate and rhythm, the flow of air through your mouth and nose, snoring, body muscle movements, and chest and belly movement.  Follow up with Dr Antoine Poche after testing.

## 2012-04-20 NOTE — Progress Notes (Signed)
   HPI The patient has a history of palpitations.  Since I last saw her she had an episode of her heart rate increasing and palpitations.  This lasted for a couple of days.  She saw Dr. Tawanna Cooler and was started on Toprol.  She reports she's had fewer palpitations since that time. However, when she does have him to make her anxious. She feels her heart skipping. Ventricular rate goes up. I did see an EKG I thought that her heart rate was 115. She has not had any chest pressure, neck or arm discomfort. She's not had any new shortness of breath, PND or orthopnea. She's had no edema. Of note she went to the beach with her daughter and had to share her room. He was told she snores loudly and apparently had apnea.  No Known Allergies  Current Outpatient Prescriptions  Medication Sig Dispense Refill  . metoprolol succinate (TOPROL-XL) 25 MG 24 hr tablet Take 1 tablet (25 mg total) by mouth daily.  90 tablet  3  . SYNTHROID 50 MCG tablet TAKE 1 TABLET EVERY DAY  100 tablet  3    Past Medical History  Diagnosis Date  . Thyroid disease     Goiter    Past Surgical History  Procedure Date  . Breast surgery     right lump  . Tubal ligation   . Thyroid     ROS:  Anxiety.  As stated in the HPI and negative for all other systems.  PHYSICAL EXAM BP 155/91  Pulse 88  Ht 5\' 4"  (1.626 m)  Wt 68.402 kg (150 lb 12.8 oz)  BMI 25.88 kg/m2 GENERAL:  Well appearing HEENT:  Pupils equal round and reactive, fundi not visualized, oral mucosa unremarkable NECK:  No jugular venous distention, waveform within normal limits, carotid upstroke brisk and symmetric, no bruits, no thyromegaly LYMPHATICS:  No cervical, inguinal adenopathy LUNGS:  Clear to auscultation bilaterally BACK:  No CVA tenderness CHEST:  Unremarkable HEART:  PMI not displaced or sustained,S1 and S2 within normal limits, no S3, no S4, no clicks, no rubs, no murmurs ABD:  Flat, positive bowel sounds normal in frequency in pitch, no bruits, no  rebound, no guarding, no midline pulsatile mass, no hepatomegaly, no splenomegaly EXT:  2 plus pulses throughout, no edema, no cyanosis no clubbing SKIN:  No rashes no nodules NEURO:  Cranial nerves II through XII grossly intact, motor grossly intact throughout PSYCH:  Cognitively intact, oriented to person place and time  EKG:  Sinus rhythm, rate 88, axis within normal limits, intervals within normal limits, RSR prime V1 and V2, no acute ST-T wave changes.  04/20/2012   ASSESSMENT AND PLAN  Tachycardia -  She is having palpitations as described. She was having ectopy during the exam. She's had PACs and brief atrial tach on Holter.  She is better with the beta blocker.  I will increase the Toprol to 50 mg and I will order an echo to make sure that she has a structurally normal heart.   Snoring -  She now would like to have a sleep study as her family has told her how severe her snoring and apparent apnea is.

## 2012-04-23 ENCOUNTER — Encounter (HOSPITAL_BASED_OUTPATIENT_CLINIC_OR_DEPARTMENT_OTHER): Payer: BC Managed Care – PPO

## 2012-04-23 ENCOUNTER — Other Ambulatory Visit (INDEPENDENT_AMBULATORY_CARE_PROVIDER_SITE_OTHER): Payer: BC Managed Care – PPO

## 2012-04-23 DIAGNOSIS — Z0000 Encounter for general adult medical examination without abnormal findings: Secondary | ICD-10-CM

## 2012-04-23 LAB — HEPATIC FUNCTION PANEL
ALT: 28 U/L (ref 0–35)
AST: 22 U/L (ref 0–37)
Albumin: 4 g/dL (ref 3.5–5.2)
Alkaline Phosphatase: 82 U/L (ref 39–117)
Total Protein: 8.1 g/dL (ref 6.0–8.3)

## 2012-04-23 LAB — LDL CHOLESTEROL, DIRECT: Direct LDL: 184.1 mg/dL

## 2012-04-23 LAB — LIPID PANEL
Cholesterol: 244 mg/dL — ABNORMAL HIGH (ref 0–200)
Triglycerides: 211 mg/dL — ABNORMAL HIGH (ref 0.0–149.0)

## 2012-04-26 ENCOUNTER — Other Ambulatory Visit (HOSPITAL_COMMUNITY): Payer: BC Managed Care – PPO

## 2012-04-27 ENCOUNTER — Ambulatory Visit (INDEPENDENT_AMBULATORY_CARE_PROVIDER_SITE_OTHER): Payer: BC Managed Care – PPO | Admitting: Family Medicine

## 2012-04-27 ENCOUNTER — Encounter: Payer: Self-pay | Admitting: Family Medicine

## 2012-04-27 VITALS — BP 120/80 | Temp 98.2°F | Ht 64.75 in | Wt 150.0 lb

## 2012-04-27 DIAGNOSIS — I499 Cardiac arrhythmia, unspecified: Secondary | ICD-10-CM

## 2012-04-27 DIAGNOSIS — R0609 Other forms of dyspnea: Secondary | ICD-10-CM

## 2012-04-27 DIAGNOSIS — Z23 Encounter for immunization: Secondary | ICD-10-CM

## 2012-04-27 DIAGNOSIS — J309 Allergic rhinitis, unspecified: Secondary | ICD-10-CM

## 2012-04-27 DIAGNOSIS — E039 Hypothyroidism, unspecified: Secondary | ICD-10-CM

## 2012-04-27 DIAGNOSIS — R0683 Snoring: Secondary | ICD-10-CM

## 2012-04-27 DIAGNOSIS — R000 Tachycardia, unspecified: Secondary | ICD-10-CM

## 2012-04-27 MED ORDER — LEVOTHYROXINE SODIUM 50 MCG PO TABS: 50.0000 ug | ORAL_TABLET | Freq: Every day | ORAL | Status: DC

## 2012-04-27 MED ORDER — METOPROLOL SUCCINATE ER 25 MG PO TB24: 25.0000 mg | ORAL_TABLET | Freq: Every day | ORAL | Status: DC

## 2012-04-27 MED ORDER — ESTROGENS, CONJUGATED 0.625 MG/GM VA CREA: TOPICAL_CREAM | Freq: Every day | VAGINAL | Status: DC

## 2012-04-27 NOTE — Patient Instructions (Signed)
Continue the Toprol 25 mg daily  If you have an occasional episode of rapid heart rate double the dose  Continue the thyroid once daily  Use small amounts of the hormonal cream twice weekly  Return in one year sooner if any problems

## 2012-04-27 NOTE — Progress Notes (Signed)
  Subjective:    Patient ID: Natalie [name], female    DOB: [birth year], 66 y.o.   MRN: [id number]  HPI Natalie [name] is a 66 year old single female nonsmoker who comes in today for general physical examination today because of a history of hypo-thyroidism, multinodular goiter, and palpitations  She takes Synthroid 50 mcg daily and is asymptomatic  She recently had a cardiac evaluation by Dr. Leta Jungling who recommended increasing her Toprol to 50 mg daily getting an echocardiogram and a sleep study. However she has very poor insurance and would have today out of pocket for both studies. She's also continue the 25 mg of Toprol and feels fine and has had no breakthrough tachycardia.  She gets routine eye care, dental care, BSE monthly, and you mammography, colonoscopy, tetanus 2010, seasonal flu shot today, information given on shingles. LMP around age 69 since she's had no GYN problems recommend Paps every 3 years   Review of Systems  Constitutional: Negative.   HENT: Negative.   Eyes: Negative.   Respiratory: Negative.   Cardiovascular: Negative.   Gastrointestinal: Negative.   Genitourinary: Negative.   Musculoskeletal: Negative.   Neurological: Negative.   Hematological: Negative.   Psychiatric/Behavioral: Negative.        Objective:   Physical Exam  Constitutional: She appears well-developed and well-nourished.  HENT:  Head: Normocephalic and atraumatic.  Right Ear: External ear normal.  Left Ear: External ear normal.  Nose: Nose normal.  Mouth/Throat: Oropharynx is clear and moist.  Eyes: EOM are normal. Pupils are equal, round, and reactive to light.  Neck: Normal range of motion. Neck supple. No thyromegaly present.       Multinodular goiter  Cardiovascular: Normal rate, regular rhythm, normal heart sounds and intact distal pulses.  Exam reveals no gallop and no friction rub.   No murmur heard. Pulmonary/Chest: Effort normal and breath sounds normal.  Abdominal: Soft. Bowel  sounds are normal. She exhibits no distension and no mass. There is no tenderness. There is no rebound.  Genitourinary:       Bilateral breast exam shows scar right breast from previous biopsy no palpable abnormalities  Musculoskeletal: Normal range of motion.  Lymphadenopathy:    She has no cervical adenopathy.  Neurological: She is alert. She has normal reflexes. No cranial nerve deficit. She exhibits normal muscle tone. Coordination normal.  Skin: Skin is warm and dry.  Psychiatric: She has a normal mood and affect. Her behavior is normal. Judgment and thought content normal.          Assessment & Plan:  Healthy female  Episodes of rapid heart rate controlled with beta blocker 25 mg daily  Multinodular goiter continue thyroid suppression 50 mcg daily  Encouraged BSE monthly pelvic and Paps every 3 years  Postmenopausal vaginal dryness Premarin vaginal cream twice weekly

## 2012-05-04 ENCOUNTER — Ambulatory Visit: Payer: BC Managed Care – PPO | Admitting: Family Medicine

## 2012-05-07 ENCOUNTER — Ambulatory Visit: Payer: BC Managed Care – PPO | Admitting: Cardiology

## 2012-05-10 ENCOUNTER — Other Ambulatory Visit (HOSPITAL_COMMUNITY): Payer: BC Managed Care – PPO

## 2012-05-31 ENCOUNTER — Other Ambulatory Visit: Payer: BC Managed Care – PPO

## 2012-06-07 ENCOUNTER — Encounter: Payer: BC Managed Care – PPO | Admitting: Family Medicine

## 2012-07-09 ENCOUNTER — Other Ambulatory Visit: Payer: Self-pay | Admitting: Family Medicine

## 2013-04-07 ENCOUNTER — Other Ambulatory Visit: Payer: Self-pay | Admitting: Gynecology

## 2013-04-21 ENCOUNTER — Other Ambulatory Visit (INDEPENDENT_AMBULATORY_CARE_PROVIDER_SITE_OTHER): Payer: Medicare Other

## 2013-04-21 DIAGNOSIS — Z0000 Encounter for general adult medical examination without abnormal findings: Secondary | ICD-10-CM

## 2013-04-21 DIAGNOSIS — E039 Hypothyroidism, unspecified: Secondary | ICD-10-CM

## 2013-04-21 DIAGNOSIS — R7989 Other specified abnormal findings of blood chemistry: Secondary | ICD-10-CM

## 2013-04-21 LAB — BASIC METABOLIC PANEL
Calcium: 9.3 mg/dL (ref 8.4–10.5)
Creatinine, Ser: 0.8 mg/dL (ref 0.4–1.2)
GFR: 81.03 mL/min (ref >60.00)
Sodium: 143 mEq/L (ref 135–145)

## 2013-04-21 LAB — POCT URINALYSIS DIPSTICK
Glucose, UA: NEGATIVE
Ketones, UA: NEGATIVE
Spec Grav, UA: 1.015
Urobilinogen, UA: 0.2

## 2013-04-21 LAB — HEPATIC FUNCTION PANEL
AST: 26 U/L (ref 0–37)
Albumin: 4 g/dL (ref 3.5–5.2)
Alkaline Phosphatase: 75 U/L (ref 39–117)
Bilirubin, Direct: 0.1 mg/dL (ref 0.0–0.3)

## 2013-04-21 LAB — LIPID PANEL: HDL: 44 mg/dL (ref >39.00)

## 2013-04-21 LAB — CBC WITH DIFFERENTIAL/PLATELET
Basophils Absolute: 0 10*3/uL (ref 0.0–0.1)
Basophils Relative: 0.4 % (ref 0.0–3.0)
Eosinophils Absolute: 0.1 10*3/uL (ref 0.0–0.7)
Hemoglobin: 13.2 g/dL (ref 12.0–15.0)
Lymphocytes Relative: 24.7 % (ref 12.0–46.0)
Monocytes Relative: 8.4 % (ref 3.0–12.0)
Neutro Abs: 5.4 10*3/uL (ref 1.4–7.7)
Neutrophils Relative %: 65.8 % (ref 43.0–77.0)
RBC: 4.36 Mil/uL (ref 3.87–5.11)

## 2013-04-21 LAB — LDL CHOLESTEROL, DIRECT: Direct LDL: 166.8 mg/dL

## 2013-04-28 ENCOUNTER — Ambulatory Visit (INDEPENDENT_AMBULATORY_CARE_PROVIDER_SITE_OTHER): Payer: Medicare Other | Admitting: Family Medicine

## 2013-04-28 ENCOUNTER — Encounter: Payer: Self-pay | Admitting: Family Medicine

## 2013-04-28 VITALS — BP 130/90 | Temp 98.2°F | Ht 65.0 in | Wt 150.0 lb

## 2013-04-28 DIAGNOSIS — Z0000 Encounter for general adult medical examination without abnormal findings: Secondary | ICD-10-CM

## 2013-04-28 DIAGNOSIS — Z23 Encounter for immunization: Secondary | ICD-10-CM

## 2013-04-28 DIAGNOSIS — M810 Age-related osteoporosis without current pathological fracture: Secondary | ICD-10-CM

## 2013-04-28 DIAGNOSIS — IMO0001 Reserved for inherently not codable concepts without codable children: Secondary | ICD-10-CM

## 2013-04-28 DIAGNOSIS — R000 Tachycardia, unspecified: Secondary | ICD-10-CM

## 2013-04-28 DIAGNOSIS — J309 Allergic rhinitis, unspecified: Secondary | ICD-10-CM

## 2013-04-28 DIAGNOSIS — N760 Acute vaginitis: Secondary | ICD-10-CM

## 2013-04-28 DIAGNOSIS — E039 Hypothyroidism, unspecified: Secondary | ICD-10-CM

## 2013-04-28 DIAGNOSIS — I499 Cardiac arrhythmia, unspecified: Secondary | ICD-10-CM

## 2013-04-28 DIAGNOSIS — R0683 Snoring: Secondary | ICD-10-CM

## 2013-04-28 MED ORDER — METOPROLOL SUCCINATE ER 25 MG PO TB24: 25.0000 mg | ORAL_TABLET | Freq: Every day | ORAL | Status: DC

## 2013-04-28 MED ORDER — ESTROGENS, CONJUGATED 0.625 MG/GM VA CREA: TOPICAL_CREAM | VAGINAL | Status: DC

## 2013-04-28 MED ORDER — LEVOTHYROXINE SODIUM 50 MCG PO TABS: 50.0000 ug | ORAL_TABLET | Freq: Every day | ORAL | Status: DC

## 2013-04-28 NOTE — Patient Instructions (Signed)
Continue current medication  Start a walking program or exercise program 30 minutes daily  Take an aspirin tablet daily  Return in one year sooner if any problems

## 2013-04-28 NOTE — Progress Notes (Signed)
  Subjective:    Patient ID: Natalie [name] Service, female    DOB: 1946/06/13, 67 y.o.   MRN: [id number]  HPI Natalie [name] is a delightful 67 year old married female nonsmoker retired who comes in today for her first Medicare wellness examination  She is his thyroid 50 mcg daily TSH level normal therefore continue current dose  She takes Toprol 25 mg daily because of a history of PAT asymptomatic on Toprol  She was using the hormonal cream but she stopped it because it was too expensive.  She had a Pap smear and a pelvic exam in September which were normal. She's never had any GYN problems. Recommend a Pap every 3 years  She gets routine eye care, dental care, BSE monthly, and you mammography, colonoscopy last year normal  Tetanus 2010 seasonal flu shot and Pneumovax today information given on shingles  She was having some thoracic back pain last week actually it's been about a month. She took 200 mg of Motrin daily and that seem to help. No fever chills night sweats etc.   Review of Systems  Constitutional: Negative.   HENT: Negative.   Eyes: Negative.   Respiratory: Negative.   Cardiovascular: Negative.   Gastrointestinal: Negative.   Endocrine: Negative.   Genitourinary: Negative.   Musculoskeletal: Negative.   Allergic/Immunologic: Negative.   Neurological: Negative.   Hematological: Negative.   Psychiatric/Behavioral: Negative.        Objective:   Physical Exam  Constitutional: She appears well-developed and well-nourished.  HENT:  Head: Normocephalic and atraumatic.  Right Ear: External ear normal.  Left Ear: External ear normal.  Nose: Nose normal.  Mouth/Throat: Oropharynx is clear and moist.  Eyes: EOM are normal. Pupils are equal, round, and reactive to light.  Neck: Normal range of motion. Neck supple. No thyromegaly present.  Cardiovascular: Normal rate, regular rhythm, normal heart sounds and intact distal pulses.  Exam reveals no gallop and no friction rub.   No  murmur heard. No carotid or bruits peripheral pulses 2+ and symmetrical  Pulmonary/Chest: Effort normal and breath sounds normal.  Abdominal: Soft. Bowel sounds are normal. She exhibits no distension and no mass. There is no tenderness. There is no rebound.  Genitourinary:  Bilateral breast exam normal scar around right nipple from previous biopsy  Musculoskeletal: Normal range of motion.  Lymphadenopathy:    She has no cervical adenopathy.  Neurological: She is alert. She has normal reflexes. No cranial nerve deficit. She exhibits normal muscle tone. Coordination normal.  Skin: Skin is warm and dry.  Total body skin exam normal except for scar below her left breast where she had a basal cell carcinoma removed many years ago. Otherwise total body skin exam normal  Psychiatric: She has a normal mood and affect. Her behavior is normal. Judgment and thought content normal.          Assessment & Plan:  Healthy female  History of tachycardia arrhythmia continue Toprol 25 mg daily  History of hypothyroidism surgical continue Synthroid 50 mcg daily  Postmenopausal vaginal dryness Premarin cream small amounts twice weekly  Status post benign right breast biopsy

## 2013-07-28 ENCOUNTER — Other Ambulatory Visit: Payer: Self-pay

## 2014-03-03 ENCOUNTER — Encounter: Payer: Self-pay | Admitting: Family Medicine

## 2014-03-03 ENCOUNTER — Telehealth: Payer: Self-pay | Admitting: Family Medicine

## 2014-03-03 ENCOUNTER — Ambulatory Visit (INDEPENDENT_AMBULATORY_CARE_PROVIDER_SITE_OTHER): Payer: Medicare HMO | Admitting: Family Medicine

## 2014-03-03 VITALS — BP 128/88 | HR 94 | Temp 98.8°F | Ht 65.0 in | Wt 149.5 lb

## 2014-03-03 DIAGNOSIS — W57XXXA Bitten or stung by nonvenomous insect and other nonvenomous arthropods, initial encounter: Secondary | ICD-10-CM

## 2014-03-03 DIAGNOSIS — T148 Other injury of unspecified body region: Secondary | ICD-10-CM

## 2014-03-03 NOTE — Progress Notes (Signed)
Pre visit review using our clinic review tool, if applicable. No additional management support is needed unless otherwise documented below in the visit note. 

## 2014-03-03 NOTE — Telephone Encounter (Signed)
Patient Information:  Caller Name: Natalie [name]  Phone: [phone]  Patient: Natalie [name]  Gender: Female  DOB: 05/21/1946  Age: 68 Years  PCP: Stevie Kern [facility])  Office Follow Up:  Does the office need to follow up with this patient?: No  Instructions For The Office: N/A   Symptoms  Reason For Call & Symptoms: Pt is calling and states that she found tick on her on 02/25/14;  tick was located on right pelvic area above pubic hair line; today 03/03/14 noticed a red rash that is in a circle;  Reviewed Health History In EMR: Yes  Reviewed Medications In EMR: Yes  Reviewed Allergies In EMR: Yes  Reviewed Surgeries / Procedures: Yes  Date of Onset of Symptoms: 03/03/2014  Guideline(s) Used:  Tick Bite  Disposition Per Guideline:   See Today in Office  Reason For Disposition Reached:   Red ring or bull's-eye rash occurs around a deer tick bite  Advice Given:  Call Back If:  Fever or rash occur in the next 2 weeks  Bite begins to look infected  You become worse.  Patient Will Follow Care Advice:  YES  Appointment Scheduled:  03/03/2014 13:45:00 Appointment Scheduled Provider:  Maudie Mercury (TEXT 1st, after 20 mins can call), Jarrett Soho [facility])

## 2014-03-03 NOTE — Patient Instructions (Signed)
Tick Bite Information Ticks are insects that attach themselves to the skin and draw blood for food. There are various types of ticks. Common types include wood ticks and deer ticks. Most ticks live in shrubs and grassy areas. Ticks can climb onto your body when you make contact with leaves or grass where the tick is waiting. The most common places on the body for ticks to attach themselves are the scalp, neck, armpits, waist, and groin. Most tick bites are harmless, but sometimes ticks carry germs that cause diseases. These germs can be spread to a person during the tick's feeding process. The chance of a disease spreading through a tick bite depends on:   The type of tick.  Time of year.   How long the tick is attached.   Geographic location.  HOW CAN YOU PREVENT TICK BITES? Take these steps to help prevent tick bites when you are outdoors:  Wear protective clothing. Long sleeves and long pants are best.   Wear white clothes so you can see ticks more easily.  Tuck your pant legs into your socks.   If walking on a trail, stay in the middle of the trail to avoid brushing against bushes.  Avoid walking through areas with long grass.  Put insect repellent on all exposed skin and along boot tops, pant legs, and sleeve cuffs.   Check clothing, hair, and skin repeatedly and before going inside.   Brush off any ticks that are not attached.  Take a shower or bath as soon as possible after being outdoors.  WHAT IS THE PROPER WAY TO REMOVE A TICK? Ticks should be removed as soon as possible to help prevent diseases caused by tick bites. 1. If latex gloves are available, put them on before trying to remove a tick.  2. Using fine-point tweezers, grasp the tick as close to the skin as possible. You may also use curved forceps or a tick removal tool. Grasp the tick as close to its head as possible. Avoid grasping the tick on its body. 3. Pull gently with steady upward pressure until  the tick lets go. Do not twist the tick or jerk it suddenly. This may break off the tick's head or mouth parts. 4. Do not squeeze or crush the tick's body. This could force disease-carrying fluids from the tick into your body.  5. After the tick is removed, wash the bite area and your hands with soap and water or other disinfectant such as alcohol. 6. Apply a small amount of antiseptic cream or ointment to the bite site.  7. Wash and disinfect any instruments that were used.  Do not try to remove a tick by applying a hot match, petroleum jelly, or fingernail polish to the tick. These methods do not work and may increase the chances of disease being spread from the tick bite.  WHEN SHOULD YOU SEEK MEDICAL CARE? Contact your health care provider if you are unable to remove a tick from your skin or if a part of the tick breaks off and is stuck in the skin.  After a tick bite, you need to be aware of signs and symptoms that could be related to diseases spread by ticks. Contact your health care provider if you develop any of the following in the days or weeks after the tick bite:  Unexplained fever.  Rash. A circular rash that appears days or weeks after the tick bite may indicate the possibility of Lyme disease. The rash may resemble   a target with a bull's-eye and may occur at a different part of your body than the tick bite.  Redness and swelling in the area of the tick bite.   Tender, swollen lymph glands.   Diarrhea.   Weight loss.   Cough.   Fatigue.   Muscle, joint, or bone pain.   Abdominal pain.   Headache.   Lethargy or a change in your level of consciousness.  Difficulty walking or moving your legs.   Numbness in the legs.   Paralysis.  Shortness of breath.   Confusion.   Repeated vomiting.  Document Released: 06/27/2000 Document Revised: 04/20/2013 Document Reviewed: 12/08/2012 [employer] Patient Information 2015 [employer]. This information is  not intended to replace advice given to you by your health care provider. Make sure you discuss any questions you have with your health care provider.  

## 2014-03-03 NOTE — Telephone Encounter (Signed)
Noted  

## 2014-03-03 NOTE — Progress Notes (Signed)
No chief complaint on file.   HPI:  Acute visit for:  1) Tick Bite: -tick bite in [place] 5 days ago -not biting long -a little rash at area of bite, not itchy or painful -denies: fevers, nausea, malaise, HA, rash elsewhere, joint pains, flu like symptoms, muscle pain  ROS: See pertinent positives and negatives per HPI.  Past Medical History  Diagnosis Date  . Thyroid disease     Goiter    Past Surgical History  Procedure Laterality Date  . Breast surgery      right lump  . Tubal ligation    . Thyroid      Family History  Problem Relation Age of Onset  . Arthritis Other   . Cancer Other     colon, prostate  . Heart disease Mother     CHF, died age 10  . Colon polyps Father 49  . Stomach cancer Neg Hx     History   Social History  . Marital Status: Legally Separated    Spouse Name: N/A    Number of Children: 2  . Years of Education: N/A   Social History Main Topics  . Smoking status: Never Smoker   . Smokeless tobacco: Never Used  . Alcohol Use: No  . Drug Use: None  . Sexual Activity: None   Other Topics Concern  . None   Social History Narrative   Lives with husband, son and grandson.    Current outpatient prescriptions:levothyroxine (SYNTHROID) 50 MCG tablet, Take 1 tablet (50 mcg total) by mouth daily., Disp: 100 tablet, Rfl: 3;  metoprolol succinate (TOPROL-XL) 25 MG 24 hr tablet, Take 1 tablet (25 mg total) by mouth daily., Disp: 90 tablet, Rfl: 3  EXAM:  Filed Vitals:   03/03/14 1350  BP: 128/88  Pulse: 94  Temp: 98.8 F (37.1 C)    Body mass index is 24.88 kg/(m^2).  GENERAL: vitals reviewed and listed above, alert, oriented, appears well hydrated and in no acute distress  HEENT: atraumatic, conjunttiva clear, no obvious abnormalities on inspection of external nose and ears  NECK: no obvious masses on inspection  LUNGS: clear to auscultation bilaterally, no wheezes, rales or rhonchi, good air movement  CV: HRRR, no  peripheral edema  SKIN: small erythematous papule R lower abd  MS: moves all extremities without noticeable abnormality  PSYCH: pleasant and cooperative, no obvious depression or anxiety  ASSESSMENT AND PLAN:  Discussed the following assessment and plan:  Tick bite -no signs or symptoms of tick borne illness at this time -advised of various tickborne illnesses and return and emergent precuations discussed -Patient advised to return or notify a doctor immediately if symptoms worsen or persist or new concerns arise.  Patient Instructions  Tick Bite Information Ticks are insects that attach themselves to the skin and draw blood for food. There are various types of ticks. Common types include wood ticks and deer ticks. Most ticks live in shrubs and grassy areas. Ticks can climb onto your body when you make contact with leaves or grass where the tick is waiting. The most common places on the body for ticks to attach themselves are the scalp, neck, armpits, waist, and groin. Most tick bites are harmless, but sometimes ticks carry germs that cause diseases. These germs can be spread to a person during the tick's feeding process. The chance of a disease spreading through a tick bite depends on:   The type of tick.  Time of year.   How long  the tick is attached.   Geographic location.  HOW CAN YOU PREVENT TICK BITES? Take these steps to help prevent tick bites when you are outdoors:  Wear protective clothing. Long sleeves and long pants are best.   Wear white clothes so you can see ticks more easily.  Tuck your pant legs into your socks.   If walking on a trail, stay in the middle of the trail to avoid brushing against bushes.  Avoid walking through areas with long grass.  Put insect repellent on all exposed skin and along boot tops, pant legs, and sleeve cuffs.   Check clothing, hair, and skin repeatedly and before going inside.   Brush off any ticks that are not  attached.  Take a shower or bath as soon as possible after being outdoors.  WHAT IS THE PROPER WAY TO REMOVE A TICK? Ticks should be removed as soon as possible to help prevent diseases caused by tick bites. 1. If latex gloves are available, put them on before trying to remove a tick.  2. Using fine-point tweezers, grasp the tick as close to the skin as possible. You may also use curved forceps or a tick removal tool. Grasp the tick as close to its head as possible. Avoid grasping the tick on its body. 3. Pull gently with steady upward pressure until the tick lets go. Do not twist the tick or jerk it suddenly. This may break off the tick's head or mouth parts. 4. Do not squeeze or crush the tick's body. This could force disease-carrying fluids from the tick into your body.  5. After the tick is removed, wash the bite area and your hands with soap and water or other disinfectant such as alcohol. 6. Apply a small amount of antiseptic cream or ointment to the bite site.  7. Wash and disinfect any instruments that were used.  Do not try to remove a tick by applying a hot match, petroleum jelly, or fingernail polish to the tick. These methods do not work and may increase the chances of disease being spread from the tick bite.  WHEN SHOULD YOU SEEK MEDICAL CARE? Contact your health care provider if you are unable to remove a tick from your skin or if a part of the tick breaks off and is stuck in the skin.  After a tick bite, you need to be aware of signs and symptoms that could be related to diseases spread by ticks. Contact your health care provider if you develop any of the following in the days or weeks after the tick bite:  Unexplained fever.  Rash. A circular rash that appears days or weeks after the tick bite may indicate the possibility of Lyme disease. The rash may resemble a target with a bull's-eye and may occur at a different part of your body than the tick bite.  Redness and swelling  in the area of the tick bite.   Tender, swollen lymph glands.   Diarrhea.   Weight loss.   Cough.   Fatigue.   Muscle, joint, or bone pain.   Abdominal pain.   Headache.   Lethargy or a change in your level of consciousness.  Difficulty walking or moving your legs.   Numbness in the legs.   Paralysis.  Shortness of breath.   Confusion.   Repeated vomiting.  Document Released: 06/27/2000 Document Revised: 04/20/2013 Document Reviewed: 12/08/2012 [facility] Patient Information 2015 [place], [place]. This information is not intended to replace advice given to you by  your health care provider. Make sure you discuss any questions you have with your health care provider.      Colin Benton R.

## 2014-03-30 ENCOUNTER — Encounter: Payer: Self-pay | Admitting: Family Medicine

## 2014-03-30 ENCOUNTER — Ambulatory Visit (INDEPENDENT_AMBULATORY_CARE_PROVIDER_SITE_OTHER): Payer: Medicare HMO | Admitting: Family Medicine

## 2014-03-30 VITALS — BP 120/84 | Temp 98.4°F | Wt 149.0 lb

## 2014-03-30 DIAGNOSIS — G43109 Migraine with aura, not intractable, without status migrainosus: Secondary | ICD-10-CM | POA: Insufficient documentation

## 2014-03-30 DIAGNOSIS — G43101 Migraine with aura, not intractable, with status migrainosus: Secondary | ICD-10-CM

## 2014-03-30 DIAGNOSIS — G43111 Migraine with aura, intractable, with status migrainosus: Secondary | ICD-10-CM

## 2014-03-30 MED ORDER — TRAMADOL HCL 50 MG PO TABS: ORAL_TABLET | ORAL | Status: DC

## 2014-03-30 MED ORDER — PREDNISONE 20 MG PO TABS: ORAL_TABLET | ORAL | Status: DC

## 2014-03-30 MED ORDER — RIZATRIPTAN BENZOATE 5 MG PO TBDP: 5.0000 mg | ORAL_TABLET | ORAL | Status: DC | PRN

## 2014-03-30 NOTE — Progress Notes (Signed)
   Subjective:    Patient ID: Natalie [name], female    DOB: [birth year], 68 y.o.   MRN: [id number]  HPI Natalie [name] is a 68 year old female who comes in today for evaluation of a headache this lasted 4 days  She had a headache starting when she was a teenager. It would occur day or 2 before her period started. She went through menopause uneventfully about 16 years ago and the headaches diminished except for an occasional headache.  A week ago she had a headache this started and it wouldn't go away for about 4 days. She took over-the-counter medication. Now the headache is recurred. It's always in the same part of her head posterior. It's dull does not radiate to 7 on a scale of 1-10. She occasionally would have a visual aura the aura the headaches. She's tried over-the-counter medication to no avail.   Review of Systems    no neurologic symptoms specifically vision hearing speech strength etc. are all normal Objective:   Physical Exam  Well-developed well nourished female no acute distress sitting quietly in a dark room because light bothers her. Also noise bothers her.  HEENT were negative the neck was supple no adenopathy neurologic exam she is oriented x3 cranial nerves II through XII are intact and symmetrical. Sensation reflexes muscle strength, normal limits. This for normal      Assessment & Plan:  Migraine headache,,,,,,,,,, prednisone burst and taper,,,,,,,

## 2014-03-30 NOTE — Patient Instructions (Signed)
Maxalt 5 mg............Marland Kitchen 1 sublingual at the start of a headache  For this headache prednisone 20 mg............... 2 tabs daily until the headache stops then taper as outlined  Tramadol 50 mg,,,,,,,, one half to one tablet every 6-8 hours when necessary for pain  Return when necessary

## 2014-03-30 NOTE — Progress Notes (Signed)
Pre visit review using our clinic review tool, if applicable. No additional management support is needed unless otherwise documented below in the visit note. 

## 2014-04-24 ENCOUNTER — Other Ambulatory Visit (INDEPENDENT_AMBULATORY_CARE_PROVIDER_SITE_OTHER): Payer: Medicare HMO

## 2014-04-24 DIAGNOSIS — I10 Essential (primary) hypertension: Secondary | ICD-10-CM

## 2014-04-24 DIAGNOSIS — Z79899 Other long term (current) drug therapy: Secondary | ICD-10-CM

## 2014-04-24 DIAGNOSIS — E039 Hypothyroidism, unspecified: Secondary | ICD-10-CM

## 2014-04-24 DIAGNOSIS — Z0000 Encounter for general adult medical examination without abnormal findings: Secondary | ICD-10-CM

## 2014-04-24 LAB — CBC WITH DIFFERENTIAL/PLATELET
Basophils Absolute: 0 10*3/uL (ref 0.0–0.1)
Basophils Relative: 0.3 % (ref 0.0–3.0)
EOS PCT: 1.2 % (ref 0.0–5.0)
Eosinophils Absolute: 0.1 10*3/uL (ref 0.0–0.7)
HCT: 41 % (ref 36.0–46.0)
Hemoglobin: 13.5 g/dL (ref 12.0–15.0)
LYMPHS PCT: 22.5 % (ref 12.0–46.0)
Lymphs Abs: 2 10*3/uL (ref 0.7–4.0)
MCHC: 32.9 g/dL (ref 30.0–36.0)
MCV: 90.8 fl (ref 78.0–100.0)
MONO ABS: 0.9 10*3/uL (ref 0.1–1.0)
Monocytes Relative: 9.7 % (ref 3.0–12.0)
NEUTROS PCT: 66.3 % (ref 43.0–77.0)
Neutro Abs: 6 10*3/uL (ref 1.4–7.7)
Platelets: 325 10*3/uL (ref 150.0–400.0)
RBC: 4.51 Mil/uL (ref 3.87–5.11)
RDW: 13.2 % (ref 11.5–15.5)
WBC: 9.1 10*3/uL (ref 4.0–10.5)

## 2014-04-24 LAB — HEPATIC FUNCTION PANEL
ALT: 27 U/L (ref 0–35)
AST: 20 U/L (ref 0–37)
Albumin: 3.5 g/dL (ref 3.5–5.2)
Alkaline Phosphatase: 80 U/L (ref 39–117)
Bilirubin, Direct: 0 mg/dL (ref 0.0–0.3)
Total Bilirubin: 0.4 mg/dL (ref 0.2–1.2)
Total Protein: 8.3 g/dL (ref 6.0–8.3)

## 2014-04-24 LAB — POCT URINALYSIS DIPSTICK
Bilirubin, UA: NEGATIVE
Blood, UA: NEGATIVE
GLUCOSE UA: NEGATIVE
KETONES UA: NEGATIVE
Nitrite, UA: NEGATIVE
Protein, UA: NEGATIVE
Spec Grav, UA: 1.015
Urobilinogen, UA: 0.2
pH, UA: 6.5

## 2014-04-24 LAB — BASIC METABOLIC PANEL
BUN: 17 mg/dL (ref 6–23)
CALCIUM: 9.4 mg/dL (ref 8.4–10.5)
CO2: 22 mEq/L (ref 19–32)
CREATININE: 0.8 mg/dL (ref 0.4–1.2)
Chloride: 104 mEq/L (ref 96–112)
GFR: 74 mL/min (ref >60.00)
Glucose, Bld: 88 mg/dL (ref 70–99)
Potassium: 4.6 mEq/L (ref 3.5–5.1)
Sodium: 141 mEq/L (ref 135–145)

## 2014-04-24 LAB — LIPID PANEL
CHOLESTEROL: 231 mg/dL — AB (ref 0–200)
HDL: 37.8 mg/dL — AB (ref >39.00)
LDL Cholesterol: 163 mg/dL — ABNORMAL HIGH (ref 0–99)
NonHDL: 193.2
TRIGLYCERIDES: 153 mg/dL — AB (ref 0.0–149.0)
Total CHOL/HDL Ratio: 6
VLDL: 30.6 mg/dL (ref 0.0–40.0)

## 2014-04-24 LAB — TSH: TSH: 0.88 u[IU]/mL (ref 0.35–4.50)

## 2014-04-24 NOTE — Addendum Note (Signed)
Addended by: Elmer Picker on: 04/24/2014 09:35 AM   Modules accepted: Orders

## 2014-05-01 ENCOUNTER — Ambulatory Visit (INDEPENDENT_AMBULATORY_CARE_PROVIDER_SITE_OTHER): Payer: Medicare HMO | Admitting: Family Medicine

## 2014-05-01 ENCOUNTER — Encounter: Payer: Self-pay | Admitting: Family Medicine

## 2014-05-01 VITALS — BP 140/90 | Temp 98.4°F | Ht 64.5 in | Wt 152.0 lb

## 2014-05-01 DIAGNOSIS — R030 Elevated blood-pressure reading, without diagnosis of hypertension: Secondary | ICD-10-CM

## 2014-05-01 DIAGNOSIS — R0683 Snoring: Secondary | ICD-10-CM

## 2014-05-01 DIAGNOSIS — E039 Hypothyroidism, unspecified: Secondary | ICD-10-CM

## 2014-05-01 DIAGNOSIS — IMO0001 Reserved for inherently not codable concepts without codable children: Secondary | ICD-10-CM

## 2014-05-01 DIAGNOSIS — Z23 Encounter for immunization: Secondary | ICD-10-CM

## 2014-05-01 DIAGNOSIS — R000 Tachycardia, unspecified: Secondary | ICD-10-CM

## 2014-05-01 MED ORDER — LEVOTHYROXINE SODIUM 50 MCG PO TABS: 50.0000 ug | ORAL_TABLET | Freq: Every day | ORAL | Status: DC

## 2014-05-01 MED ORDER — METOPROLOL SUCCINATE ER 25 MG PO TB24: 25.0000 mg | ORAL_TABLET | Freq: Every day | ORAL | Status: DC

## 2014-05-01 NOTE — Progress Notes (Signed)
   Subjective:    Patient ID: Natalie [name], female    DOB: 11-28-1945, 68 y.o.   MRN: [id number]  HPI Natalie [name] is a 68 year old female who comes in today for evaluation of hypertension migraine headaches and hypothyroidism  She is a history of elevated blood pressure slightly and on Toprol 25 mg daily. She's basically was put on that for the tachycardia cardia. Asymptomatic BP again 140/90  She takes Synthroid 50 mcg daily TSH is normal  She takes migraine medication.........Marland Kitchen Maxalt,,,,,,,,,, but she admits to take it the migraines have basically gone away  She gets routine eye care, dental care, BSE monthly, and you mammography, colonoscopy 4 years ago normal. She is retired and walks 3 miles daily  She saw Dr. is Sb,,,,,,,,,,, he did a pelvic and Pap last year and they were normal. Recommended every 3 years since he is asymptomatic   Review of Systems  Constitutional: Negative.   HENT: Negative.   Eyes: Negative.   Respiratory: Negative.   Cardiovascular: Negative.   Gastrointestinal: Negative.   Endocrine: Negative.   Genitourinary: Negative.   Musculoskeletal: Negative.   Skin: Negative.   Allergic/Immunologic: Negative.   Neurological: Negative.   Hematological: Negative.   Psychiatric/Behavioral: Negative.        Objective:   Physical Exam  Nursing note and vitals reviewed. Constitutional: She appears well-developed and well-nourished.  HENT:  Head: Normocephalic and atraumatic.  Right Ear: External ear normal.  Left Ear: External ear normal.  Nose: Nose normal.  Mouth/Throat: Oropharynx is clear and moist.  Eyes: EOM are normal. Pupils are equal, round, and reactive to light.  Neck: Normal range of motion. Neck supple. No JVD present. No tracheal deviation present. No thyromegaly present.  Cardiovascular: Normal rate, regular rhythm, normal heart sounds and intact distal pulses.  Exam reveals no gallop and no friction rub.   No murmur  heard. Pulmonary/Chest: Effort normal and breath sounds normal. No stridor. No respiratory distress. She has no wheezes. She has no rales. She exhibits no tenderness.  Abdominal: Soft. Bowel sounds are normal. She exhibits no distension and no mass. There is no tenderness. There is no rebound and no guarding.  Genitourinary:  Bilateral breast exam normal  Musculoskeletal: Normal range of motion.  Lymphadenopathy:    She has no cervical adenopathy.  Neurological: She is alert. She has normal reflexes. No cranial nerve deficit. She exhibits normal muscle tone. Coordination normal.  Skin: Skin is warm and dry. No rash noted. No erythema. No pallor.  Total body skin exam normal  Psychiatric: She has a normal mood and affect. Her behavior is normal. Judgment and thought content normal.          Assessment & Plan:  Healthy female  Hypertension at goal. Continue current therapy  Hypothyroidism .....Marland Kitchen continue current therapy  Migraine headaches .......Marland Kitchen Maxalt when necessary .......Marland Kitchen

## 2014-05-01 NOTE — Patient Instructions (Signed)
Continue current medications  Continue your exercise program  Work on a diet as we talked  Followup in 1 year sooner if any problems

## 2014-05-01 NOTE — Progress Notes (Signed)
Pre visit review using our clinic review tool, if applicable. No additional management support is needed unless otherwise documented below in the visit note. 

## 2014-05-22 ENCOUNTER — Other Ambulatory Visit: Payer: Self-pay | Admitting: Family Medicine

## 2014-06-19 ENCOUNTER — Other Ambulatory Visit: Payer: Self-pay | Admitting: Family Medicine

## 2014-12-13 ENCOUNTER — Telehealth: Payer: Self-pay | Admitting: *Deleted

## 2014-12-13 NOTE — Telephone Encounter (Signed)
I called the pt and she stated she had her mammogram in October 2015 at Dr Diamantina Monks (OB/GYN).  Health maintenance record was updated.

## 2015-04-23 ENCOUNTER — Encounter: Payer: Self-pay | Admitting: Gastroenterology

## 2015-04-24 ENCOUNTER — Ambulatory Visit (INDEPENDENT_AMBULATORY_CARE_PROVIDER_SITE_OTHER): Payer: Medicare HMO

## 2015-04-24 DIAGNOSIS — Z23 Encounter for immunization: Secondary | ICD-10-CM | POA: Diagnosis not present

## 2015-04-24 NOTE — Progress Notes (Signed)
   Subjective:    Patient ID: Natalie [name], female    DOB: [birth year], 69 y.o.   MRN: [id number]  HPI    Review of Systems     Objective:   Physical Exam        Assessment & Plan:  Pt was seen for flu vaccine only

## 2015-04-27 ENCOUNTER — Other Ambulatory Visit: Payer: Self-pay

## 2015-04-27 DIAGNOSIS — Z1231 Encounter for screening mammogram for malignant neoplasm of breast: Secondary | ICD-10-CM | POA: Diagnosis not present

## 2015-04-27 DIAGNOSIS — Z124 Encounter for screening for malignant neoplasm of cervix: Secondary | ICD-10-CM | POA: Diagnosis not present

## 2015-04-27 DIAGNOSIS — Z01419 Encounter for gynecological examination (general) (routine) without abnormal findings: Secondary | ICD-10-CM | POA: Diagnosis not present

## 2015-04-30 LAB — CYTOLOGY - PAP

## 2015-05-17 ENCOUNTER — Telehealth: Payer: Self-pay | Admitting: Family Medicine

## 2015-05-17 DIAGNOSIS — R0683 Snoring: Secondary | ICD-10-CM

## 2015-05-17 DIAGNOSIS — R000 Tachycardia, unspecified: Secondary | ICD-10-CM

## 2015-05-17 NOTE — Telephone Encounter (Signed)
Pt has cpx sch for jan 2017.Marland Kitchen Pt needs new rxs levothyroxine #30 w/refills and metoprolol #90 w/refills  send to [facility] pharm [facility] [address].

## 2015-05-18 MED ORDER — LEVOTHYROXINE SODIUM 50 MCG PO TABS: 50.0000 ug | ORAL_TABLET | Freq: Every day | ORAL | Status: DC

## 2015-05-18 MED ORDER — METOPROLOL SUCCINATE ER 25 MG PO TB24
25.0000 mg | ORAL_TABLET | Freq: Every day | ORAL | Status: DC
Start: 2015-05-18 — End: 2015-05-18

## 2015-05-18 MED ORDER — METOPROLOL SUCCINATE ER 25 MG PO TB24: 25.0000 mg | ORAL_TABLET | Freq: Every day | ORAL | Status: DC

## 2015-05-19 ENCOUNTER — Other Ambulatory Visit: Payer: Self-pay | Admitting: Obstetrics and Gynecology

## 2015-05-19 DIAGNOSIS — E2839 Other primary ovarian failure: Secondary | ICD-10-CM

## 2015-06-04 DIAGNOSIS — H524 Presbyopia: Secondary | ICD-10-CM | POA: Diagnosis not present

## 2015-06-04 DIAGNOSIS — H40023 Open angle with borderline findings, high risk, bilateral: Secondary | ICD-10-CM | POA: Diagnosis not present

## 2015-06-18 ENCOUNTER — Ambulatory Visit
Admission: RE | Admit: 2015-06-18 | Discharge: 2015-06-18 | Disposition: A | Discharge status: Home / Self Care | Payer: Medicare HMO | Source: Ambulatory Visit | Attending: Obstetrics and Gynecology | Admitting: Obstetrics and Gynecology

## 2015-06-18 DIAGNOSIS — E2839 Other primary ovarian failure: Secondary | ICD-10-CM

## 2015-06-18 DIAGNOSIS — M85851 Other specified disorders of bone density and structure, right thigh: Secondary | ICD-10-CM | POA: Diagnosis not present

## 2015-07-30 ENCOUNTER — Other Ambulatory Visit (INDEPENDENT_AMBULATORY_CARE_PROVIDER_SITE_OTHER): Payer: Medicare HMO

## 2015-07-30 DIAGNOSIS — Z0000 Encounter for general adult medical examination without abnormal findings: Secondary | ICD-10-CM | POA: Diagnosis not present

## 2015-07-30 DIAGNOSIS — E039 Hypothyroidism, unspecified: Secondary | ICD-10-CM

## 2015-07-30 LAB — CBC WITH DIFFERENTIAL/PLATELET
BASOS PCT: 0.4 % (ref 0.0–3.0)
Basophils Absolute: 0 10*3/uL (ref 0.0–0.1)
EOS PCT: 1.3 % (ref 0.0–5.0)
Eosinophils Absolute: 0.1 10*3/uL (ref 0.0–0.7)
HEMATOCRIT: 42 % (ref 36.0–46.0)
HEMOGLOBIN: 13.9 g/dL (ref 12.0–15.0)
LYMPHS PCT: 27.2 % (ref 12.0–46.0)
Lymphs Abs: 2.3 10*3/uL (ref 0.7–4.0)
MCHC: 33.1 g/dL (ref 30.0–36.0)
MCV: 90.4 fl (ref 78.0–100.0)
MONOS PCT: 10.5 % (ref 3.0–12.0)
Monocytes Absolute: 0.9 10*3/uL (ref 0.1–1.0)
Neutro Abs: 5.2 10*3/uL (ref 1.4–7.7)
Neutrophils Relative %: 60.6 % (ref 43.0–77.0)
Platelets: 321 10*3/uL (ref 150.0–400.0)
RBC: 4.64 Mil/uL (ref 3.87–5.11)
RDW: 12.9 % (ref 11.5–15.5)
WBC: 8.6 10*3/uL (ref 4.0–10.5)

## 2015-07-30 LAB — POCT URINALYSIS DIPSTICK
BILIRUBIN UA: NEGATIVE
Glucose, UA: NEGATIVE
KETONES UA: NEGATIVE
Nitrite, UA: NEGATIVE
PH UA: 5.5
Protein, UA: NEGATIVE
SPEC GRAV UA: 1.015
Urobilinogen, UA: 0.2

## 2015-07-30 LAB — LIPID PANEL
CHOL/HDL RATIO: 5
Cholesterol: 237 mg/dL — ABNORMAL HIGH (ref 0–200)
HDL: 45.6 mg/dL (ref >39.00)
LDL Cholesterol: 160 mg/dL — ABNORMAL HIGH (ref 0–99)
NONHDL: 191.34
Triglycerides: 159 mg/dL — ABNORMAL HIGH (ref 0.0–149.0)
VLDL: 31.8 mg/dL (ref 0.0–40.0)

## 2015-07-30 LAB — HEPATIC FUNCTION PANEL
ALBUMIN: 4.2 g/dL (ref 3.5–5.2)
ALT: 20 U/L (ref 0–35)
AST: 17 U/L (ref 0–37)
Alkaline Phosphatase: 94 U/L (ref 39–117)
BILIRUBIN TOTAL: 0.3 mg/dL (ref 0.2–1.2)
Bilirubin, Direct: 0.1 mg/dL (ref 0.0–0.3)
TOTAL PROTEIN: 8.1 g/dL (ref 6.0–8.3)

## 2015-07-30 LAB — BASIC METABOLIC PANEL
BUN: 17 mg/dL (ref 6–23)
CHLORIDE: 107 meq/L (ref 96–112)
CO2: 21 mEq/L (ref 19–32)
Calcium: 9.5 mg/dL (ref 8.4–10.5)
Creatinine, Ser: 0.82 mg/dL (ref 0.40–1.20)
GFR: 73.71 mL/min (ref >60.00)
Glucose, Bld: 98 mg/dL (ref 70–99)
POTASSIUM: 4.8 meq/L (ref 3.5–5.1)
SODIUM: 143 meq/L (ref 135–145)

## 2015-07-30 LAB — TSH: TSH: 1.57 u[IU]/mL (ref 0.35–4.50)

## 2015-08-06 ENCOUNTER — Encounter: Payer: Self-pay | Admitting: Family Medicine

## 2015-08-06 ENCOUNTER — Ambulatory Visit (INDEPENDENT_AMBULATORY_CARE_PROVIDER_SITE_OTHER): Payer: Medicare HMO | Admitting: Family Medicine

## 2015-08-06 VITALS — BP 124/84 | Temp 98.3°F | Ht 65.0 in | Wt 143.0 lb

## 2015-08-06 DIAGNOSIS — R030 Elevated blood-pressure reading, without diagnosis of hypertension: Secondary | ICD-10-CM

## 2015-08-06 DIAGNOSIS — G43101 Migraine with aura, not intractable, with status migrainosus: Secondary | ICD-10-CM | POA: Diagnosis not present

## 2015-08-06 DIAGNOSIS — Z0000 Encounter for general adult medical examination without abnormal findings: Secondary | ICD-10-CM | POA: Diagnosis not present

## 2015-08-06 DIAGNOSIS — IMO0001 Reserved for inherently not codable concepts without codable children: Secondary | ICD-10-CM

## 2015-08-06 DIAGNOSIS — R000 Tachycardia, unspecified: Secondary | ICD-10-CM

## 2015-08-06 DIAGNOSIS — R0683 Snoring: Secondary | ICD-10-CM

## 2015-08-06 DIAGNOSIS — E039 Hypothyroidism, unspecified: Secondary | ICD-10-CM | POA: Diagnosis not present

## 2015-08-06 MED ORDER — METOPROLOL SUCCINATE ER 25 MG PO TB24: 25.0000 mg | ORAL_TABLET | Freq: Every day | ORAL | Status: DC

## 2015-08-06 MED ORDER — LEVOTHYROXINE SODIUM 50 MCG PO TABS: 50.0000 ug | ORAL_TABLET | Freq: Every day | ORAL | Status: DC

## 2015-08-06 MED ORDER — PREDNISONE 20 MG PO TABS: ORAL_TABLET | ORAL | Status: DC

## 2015-08-06 MED ORDER — TRAMADOL HCL 50 MG PO TABS: ORAL_TABLET | ORAL | Status: DC

## 2015-08-06 MED ORDER — RIZATRIPTAN BENZOATE 5 MG PO TBDP: 5.0000 mg | ORAL_TABLET | ORAL | Status: DC | PRN

## 2015-08-06 NOTE — Progress Notes (Signed)
Pre visit review using our clinic review tool, if applicable. No additional management support is needed unless otherwise documented below in the visit note. 

## 2015-08-06 NOTE — Progress Notes (Signed)
   Subjective:    Patient ID: Natalie [name], female    DOB: 03-24-1946, 70 y.o.   MRN: [id number]  HPI Natalie [name] is a 70 year old female nonsmoker who comes in today for general physical examination  She's had a history of migraine headaches and takes Maxalt when necessary for migraines. She's out of her Maxalt. She started having a headache last Saturday and it's been persistent despite taking some tramadol. When she had this last week gave her short course of prednisone a migraine went away  She takes Synthroid 50 g daily for history of hypothyroidism. She's noted to have some asymmetry of her thyroid. Last ultrasound showed no evidence of anything abnormality  She takes Toprol 25 mg daily because of a history of sinus tachycardia. Pulse is 65 and regular  She gets routine eye care, dental care, BSE monthly, annual mammography, colonoscopy 2013 normal  Vaccinations up-to-date  GYN exam by gynecologist last fall everything was normal including bone density. Bone density did show some slight abnormalities but nothing significant. She was advised take calcium vitamin D and walk daily. She does walk on a daily basis   Review of Systems  Constitutional: Negative.   HENT: Negative.   Eyes: Negative.   Respiratory: Negative.   Cardiovascular: Negative.   Gastrointestinal: Negative.   Endocrine: Negative.   Genitourinary: Negative.   Musculoskeletal: Negative.   Skin: Negative.   Allergic/Immunologic: Negative.   Neurological: Negative.   Hematological: Negative.   Psychiatric/Behavioral: Negative.        Objective:   Physical Exam  Constitutional: She is oriented to person, place, and time. She appears well-developed and well-nourished.  HENT:  Head: Normocephalic and atraumatic.  Right Ear: External ear normal.  Left Ear: External ear normal.  Nose: Nose normal.  Mouth/Throat: Oropharynx is clear and moist.  Eyes: EOM are normal. Pupils are equal, round, and reactive to  light.  Neck: Normal range of motion. Neck supple. No JVD present. No tracheal deviation present. Thyromegaly present.  Right lobe of the right enlarged as before  Cardiovascular: Normal rate, regular rhythm, normal heart sounds and intact distal pulses.  Exam reveals no gallop and no friction rub.   No murmur heard. Pulmonary/Chest: Effort normal and breath sounds normal. No stridor. No respiratory distress. She has no wheezes. She has no rales. She exhibits no tenderness.  Abdominal: Soft. Bowel sounds are normal. She exhibits no distension and no mass. There is no tenderness. There is no rebound and no guarding.  Musculoskeletal: Normal range of motion. She exhibits no edema or tenderness.  Lymphadenopathy:    She has no cervical adenopathy.  Neurological: She is alert and oriented to person, place, and time. She has normal reflexes. No cranial nerve deficit. She exhibits normal muscle tone. Coordination normal.  Skin: Skin is warm and dry. No rash noted. No erythema. No pallor.  Total body skin exam normal  Psychiatric: She has a normal mood and affect. Her behavior is normal. Judgment and thought content normal.  Nursing note and vitals reviewed.         Assessment & Plan:  Migraine headache,,,,,,,, refill Maxalt,,,,,,, short course of prednisone  History of rapid heart rate control with Toprol 25 mg,,,,,, continue beta blocker  History of goiter,,,,, continue Synthroid 50 g daily

## 2015-08-06 NOTE — Patient Instructions (Signed)
Prednisone 20 mg......... 2 tabs 3 days, 1 tab 3 days, half a tab 3 days, then stop of the headaches gone......... if not completely gone a half a tab Monday Wednesday Friday for 2 weeks  In the future take the Maxalt immediately  Continue other medications  We will get you set up for a scan of your thyroid gland

## 2015-08-15 ENCOUNTER — Other Ambulatory Visit: Payer: Medicare HMO

## 2015-08-22 ENCOUNTER — Other Ambulatory Visit: Payer: Medicare HMO

## 2015-08-30 ENCOUNTER — Ambulatory Visit
Admission: RE | Admit: 2015-08-30 | Discharge: 2015-08-30 | Disposition: A | Discharge status: Home / Self Care | Payer: Medicare HMO | Source: Ambulatory Visit | Attending: Family Medicine | Admitting: Family Medicine

## 2015-08-30 DIAGNOSIS — E039 Hypothyroidism, unspecified: Secondary | ICD-10-CM | POA: Diagnosis not present

## 2016-04-17 ENCOUNTER — Ambulatory Visit (INDEPENDENT_AMBULATORY_CARE_PROVIDER_SITE_OTHER): Payer: Medicare HMO | Admitting: Emergency Medicine

## 2016-04-17 DIAGNOSIS — Z23 Encounter for immunization: Secondary | ICD-10-CM | POA: Diagnosis not present

## 2016-07-25 ENCOUNTER — Other Ambulatory Visit: Payer: Self-pay | Admitting: Obstetrics and Gynecology

## 2016-07-25 DIAGNOSIS — Z124 Encounter for screening for malignant neoplasm of cervix: Secondary | ICD-10-CM | POA: Diagnosis not present

## 2016-07-25 DIAGNOSIS — Z01419 Encounter for gynecological examination (general) (routine) without abnormal findings: Secondary | ICD-10-CM | POA: Diagnosis not present

## 2016-07-25 DIAGNOSIS — Z1231 Encounter for screening mammogram for malignant neoplasm of breast: Secondary | ICD-10-CM | POA: Diagnosis not present

## 2016-07-25 LAB — HM MAMMOGRAPHY: HM Mammogram: NORMAL (ref 0–4)

## 2016-07-30 LAB — CYTOLOGY - PAP

## 2016-08-05 ENCOUNTER — Other Ambulatory Visit (INDEPENDENT_AMBULATORY_CARE_PROVIDER_SITE_OTHER): Payer: Medicare HMO

## 2016-08-05 DIAGNOSIS — Z0000 Encounter for general adult medical examination without abnormal findings: Secondary | ICD-10-CM | POA: Diagnosis not present

## 2016-08-05 LAB — POC URINALSYSI DIPSTICK (AUTOMATED)
Bilirubin, UA: NEGATIVE
Blood, UA: NEGATIVE
Glucose, UA: NEGATIVE
Ketones, UA: NEGATIVE
Nitrite, UA: NEGATIVE
PH UA: 6
PROTEIN UA: NEGATIVE
Spec Grav, UA: 1.015
UROBILINOGEN UA: 0.2

## 2016-08-05 LAB — BASIC METABOLIC PANEL
BUN: 11 mg/dL (ref 6–23)
CO2: 25 mEq/L (ref 19–32)
CREATININE: 0.76 mg/dL (ref 0.40–1.20)
Calcium: 9.3 mg/dL (ref 8.4–10.5)
Chloride: 105 mEq/L (ref 96–112)
GFR: 80.23 mL/min (ref >60.00)
GLUCOSE: 95 mg/dL (ref 70–99)
POTASSIUM: 4.5 meq/L (ref 3.5–5.1)
Sodium: 140 mEq/L (ref 135–145)

## 2016-08-05 LAB — TSH: TSH: 1.46 u[IU]/mL (ref 0.35–4.50)

## 2016-08-05 LAB — CBC WITH DIFFERENTIAL/PLATELET
BASOS PCT: 0.4 % (ref 0.0–3.0)
Basophils Absolute: 0 10*3/uL (ref 0.0–0.1)
EOS ABS: 0.1 10*3/uL (ref 0.0–0.7)
EOS PCT: 0.9 % (ref 0.0–5.0)
HCT: 40.8 % (ref 36.0–46.0)
Hemoglobin: 13.8 g/dL (ref 12.0–15.0)
LYMPHS ABS: 2 10*3/uL (ref 0.7–4.0)
Lymphocytes Relative: 22.6 % (ref 12.0–46.0)
MCHC: 33.8 g/dL (ref 30.0–36.0)
MCV: 89.5 fl (ref 78.0–100.0)
MONO ABS: 0.7 10*3/uL (ref 0.1–1.0)
Monocytes Relative: 8.2 % (ref 3.0–12.0)
NEUTROS PCT: 67.9 % (ref 43.0–77.0)
Neutro Abs: 6 10*3/uL (ref 1.4–7.7)
Platelets: 288 10*3/uL (ref 150.0–400.0)
RBC: 4.55 Mil/uL (ref 3.87–5.11)
RDW: 13.4 % (ref 11.5–15.5)
WBC: 8.8 10*3/uL (ref 4.0–10.5)

## 2016-08-05 LAB — HEPATIC FUNCTION PANEL
ALT: 20 U/L (ref 0–35)
AST: 19 U/L (ref 0–37)
Albumin: 4.2 g/dL (ref 3.5–5.2)
Alkaline Phosphatase: 94 U/L (ref 39–117)
BILIRUBIN TOTAL: 0.4 mg/dL (ref 0.2–1.2)
Bilirubin, Direct: 0.1 mg/dL (ref 0.0–0.3)
Total Protein: 7.7 g/dL (ref 6.0–8.3)

## 2016-08-05 LAB — LIPID PANEL
CHOLESTEROL: 232 mg/dL — AB (ref 0–200)
HDL: 49.4 mg/dL (ref >39.00)
LDL Cholesterol: 153 mg/dL — ABNORMAL HIGH (ref 0–99)
NONHDL: 183.06
TRIGLYCERIDES: 148 mg/dL (ref 0.0–149.0)
Total CHOL/HDL Ratio: 5
VLDL: 29.6 mg/dL (ref 0.0–40.0)

## 2016-08-11 ENCOUNTER — Encounter: Payer: Self-pay | Admitting: Family Medicine

## 2016-08-11 ENCOUNTER — Ambulatory Visit (INDEPENDENT_AMBULATORY_CARE_PROVIDER_SITE_OTHER): Payer: Medicare HMO | Admitting: Family Medicine

## 2016-08-11 VITALS — BP 140/82 | HR 86 | Temp 98.4°F | Ht 64.0 in | Wt 145.8 lb

## 2016-08-11 DIAGNOSIS — I10 Essential (primary) hypertension: Secondary | ICD-10-CM | POA: Diagnosis not present

## 2016-08-11 DIAGNOSIS — I498 Other specified cardiac arrhythmias: Secondary | ICD-10-CM | POA: Diagnosis not present

## 2016-08-11 DIAGNOSIS — Z0000 Encounter for general adult medical examination without abnormal findings: Secondary | ICD-10-CM

## 2016-08-11 DIAGNOSIS — E039 Hypothyroidism, unspecified: Secondary | ICD-10-CM | POA: Diagnosis not present

## 2016-08-11 DIAGNOSIS — H04123 Dry eye syndrome of bilateral lacrimal glands: Secondary | ICD-10-CM | POA: Diagnosis not present

## 2016-08-11 DIAGNOSIS — H524 Presbyopia: Secondary | ICD-10-CM | POA: Diagnosis not present

## 2016-08-11 DIAGNOSIS — H40023 Open angle with borderline findings, high risk, bilateral: Secondary | ICD-10-CM | POA: Diagnosis not present

## 2016-08-11 MED ORDER — LEVOTHYROXINE SODIUM 75 MCG PO TABS: 75.0000 ug | ORAL_TABLET | Freq: Every day | ORAL | 4 refills | Status: DC

## 2016-08-11 MED ORDER — METOPROLOL SUCCINATE ER 25 MG PO TB24: 25.0000 mg | ORAL_TABLET | Freq: Every day | ORAL | 4 refills | Status: DC

## 2016-08-11 NOTE — Progress Notes (Signed)
Pre visit review using our clinic review tool, if applicable. No additional management support is needed unless otherwise documented below in the visit note. 

## 2016-08-11 NOTE — Progress Notes (Signed)
Natalie [name] is a 71 year old single female nonsmoker who comes in today for general physical examination because of a history of hypothyroidism thyroid nodule and rapid heart rate  She takes Synthroid 50 g daily because of a history of thyroid nodule. Follow-up ultrasounds of all been normal. Her insurance wants her to switch to the generic. We'll do that up the dose of the generic and get a TSH level in 3 months  She takes Toprol long-acting 25 mg daily because of a history of rapid heart rate. Pulse is 80 and regular  She gets routine eye care, dental care, BSE monthly, annual mammography, pelvic and Pap by GYN normal recently. Colonoscopy 2013 normal  Vaccinations up-to-date  Social history she is retired but now working part-time at [employer]  14 point review of systems reviewed and otherwise negative  EKG was done because a history rapid heart rate. EKG is unchanged  BP 140/82 (BP Location: Left Arm, Patient Position: Sitting, Cuff Size: Normal)   Pulse 86   Temp 98.4 F (36.9 C) (Oral)   Ht 5\' 4"  (1.626 m)   Wt 145 lb 12.8 oz (66.1 kg)   BMI 25.03 kg/m  Examination HEENT were negative neck was supple no adenopathy thyroid was abnormal. There is a small nodule on the right is been previously noted. Cardiopulmonary exam normal abdominal exam normal exam extremities normal skin normal peripheral pulses normal  #1 hypothyroidism thyroid nodule,,,,,,, continue Synthroid dose to 75 g of the generic daily follow-up TSH level in 3 months  #2 history of tach arrhythmia,,,,,,, continue beta blocker

## 2016-08-11 NOTE — Patient Instructions (Signed)
We noted there is a difference between the generic and the brand-name Synthroid therefore will increase the Synthroid to 75 g daily. Follow-up TSH level the last week in April.........Marland Kitchen we will call you the report  I would shop at [facility] in [place] on [address] to see we can get the best prices

## 2016-11-05 ENCOUNTER — Ambulatory Visit (INDEPENDENT_AMBULATORY_CARE_PROVIDER_SITE_OTHER): Payer: Medicare HMO

## 2016-11-05 ENCOUNTER — Other Ambulatory Visit (INDEPENDENT_AMBULATORY_CARE_PROVIDER_SITE_OTHER): Payer: Medicare HMO

## 2016-11-05 VITALS — BP 120/80 | HR 76 | Ht 64.5 in | Wt 139.0 lb

## 2016-11-05 DIAGNOSIS — E039 Hypothyroidism, unspecified: Secondary | ICD-10-CM | POA: Diagnosis not present

## 2016-11-05 DIAGNOSIS — Z1159 Encounter for screening for other viral diseases: Secondary | ICD-10-CM

## 2016-11-05 DIAGNOSIS — Z0000 Encounter for general adult medical examination without abnormal findings: Secondary | ICD-10-CM

## 2016-11-05 LAB — TSH: TSH: 0.2 u[IU]/mL — AB (ref 0.35–4.50)

## 2016-11-05 NOTE — Progress Notes (Addendum)
Subjective:   Natalie [name] is a 71 y.o. female who presents for an Initial Medicare Annual Wellness Visit.  The Patient was informed that the wellness visit is to identify future health risk and educate and initiate measures that can reduce risk for increased disease through the lifespan.    NO ROS; Medicare Wellness Visit Medical: thyroid disease  Describes health as good, fair or great?  Good   Preventive Screening -Counseling & Management  Colonoscopy 11/2011- repeat in 10 years no polyps but does have + hx of colon cancer  Dexa 06/2015 (-1.3) is followed GYN at [facility]  Was told osteopenia   Discussed Calcium and Vit d in food or supplement  Not taking supplement currently  Pap 07/2016-  Mammogram: completed in the office; have 3 d;  Nov 2017  Sits out x 15 minutes with out sunscreen   Smoking history - never smoked Second Hand Smoke status; No Smokers in the home ETOH - no  Medication adherence or issues? 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     Mobility of Functional changes this year? no Safety; community, wears sunscreen, safe place for firearms; Motor vehicle accidents; all reviewed   Mental Health:  Any emotional problems? Anxious, depressed, irritable, sad or blue? no Denies feeling depressed or hopeless; voices pleasure in daily  life How many social activities have you been engaged in within the last 2 weeks? no  Hearing Screening Comments: No hearing issues  Vision Screening Comments: Vision checks one time a year Dr. Bing Plume  Checked for glaucoma as sister has this and they are checking; she is ok now    Activities of Daily Living - See functional screen   Cognitive testing; Ad8 score; 0 or less than 2  MMSE deferred or completed if AD8 + 2 issues  Advanced Directives completed   Patient Care Team: Dorena Cookey, MD as PCP - General    Immunization History  Administered Date(s) Administered  . Influenza Split 04/24/2011, 04/27/2012  . Influenza Whole 04/17/2009  . Influenza, High Dose Seasonal PF 04/28/2013, 04/24/2015, 04/17/2016  . Influenza,inj,Quad PF,36+ Mos 05/01/2014  . Pneumococcal Conjugate-13 05/01/2014  . Pneumococcal Polysaccharide-23 04/28/2013, 04/24/2015  . Td 07/14/1997, 04/17/2009   Required Immunizations needed today  Screening test up to date or reviewed for plan of completion Health Maintenance Due  Topic Date Due  . Hepatitis C Screening  1945/12/01  . MAMMOGRAM  04/13/2016   Discussed Hep C  Mammogram completed at [facility]    Cardiac Risk Factors include: advanced age (>77men, >59 women);dyslipidemia;hypertension    Objective:    Today's Vitals   11/05/16 0842  BP: 120/80  Pulse: 76  SpO2: 98%  Weight: 139 lb (63 kg)  Height: 5' 4.5" (1.638 m)   Body mass index  is 23.49 kg/m.   Current Medications (verified) Outpatient Encounter Prescriptions as of 11/05/2016  Medication Sig  . levothyroxine (SYNTHROID) 75 MCG tablet Take 1 tablet (75 mcg total) by mouth daily before breakfast.  . metoprolol succinate (TOPROL-XL) 25 MG 24 hr tablet Take 1 tablet (25 mg total) by mouth daily.  . rizatriptan (MAXALT-MLT) 5 MG disintegrating tablet Take 1 tablet (5 mg total) by mouth as needed for migraine. May repeat in 2 hours if needed  . traMADol (ULTRAM) 50 MG  tablet TAKE 1/2-1 TABLET BY MOUTH EVERY 6 TO 8 HOURS AS NEEDED FOR PAIN  . predniSONE (DELTASONE) 20 MG tablet 2 tabs x 3 days, 1 tab x 3 days, 1/2 tab x 3 days, 1/2 tab M,W,F x 2 weeks (Patient not taking: Reported on 11/05/2016)   No facility-administered encounter medications on file as of 11/05/2016.     Allergies (verified) Patient has no known allergies.   History: Past Medical History:  Diagnosis Date  . Thyroid disease    Goiter   Past Surgical History:  Procedure Laterality Date  . BREAST SURGERY     right lump  . thyroid    . TUBAL LIGATION     Family History  Problem Relation Age of Onset  . Colon polyps Father 6  . Arthritis Other   . Cancer Other     colon, prostate  . Heart disease Mother     CHF, died age 86  . Stomach cancer Neg Hx    Social History   Occupational History  . Not on file.   Social History Main Topics  . Smoking status: Never Smoker  . Smokeless tobacco: Never Used  . Alcohol use No  . Drug use: No  . Sexual activity: Not on file    Tobacco Counseling Counseling given: Yes   Activities of Daily Living In your present state of health, do you have any difficulty performing the following activities: 11/05/2016  Hearing? N  Vision? N  Difficulty concentrating or making decisions? N  Walking or climbing stairs? N  Dressing or bathing? N  Doing errands, shopping? N  Preparing Food and eating ? N  Using the Toilet? N  In the past six months, have you accidently leaked urine? N  Do you have problems with loss of bowel control? N  Managing your Medications? N  Managing your Finances? N  Housekeeping or managing your Housekeeping? N  Some recent data might be hidden    Immunizations and Health Maintenance Immunization History  Administered Date(s) Administered  . Influenza Split 04/24/2011, 04/27/2012  . Influenza Whole 04/17/2009  . Influenza, High Dose Seasonal PF 04/28/2013, 04/24/2015, 04/17/2016  . Influenza,inj,Quad  PF,36+ Mos 05/01/2014  . Pneumococcal Conjugate-13 05/01/2014  . Pneumococcal Polysaccharide-23 04/28/2013, 04/24/2015  . Td 07/14/1997, 04/17/2009   Health Maintenance Due  Topic Date Due  . Hepatitis C Screening  [birth year]  . MAMMOGRAM  04/13/2016    Patient Care Team: Dorena Cookey, MD as PCP - General  Indicate any recent Medical Services you may have received from other than [facility] providers in the past year (date may be approximate).     Assessment:   This is a routine wellness examination for Natalie [name].  Hearing/Vision screen Hearing Screening Comments: No hearing issues  Vision Screening Comments: Vision checks one time a year Dr. Kaylyn Layer for glaucoma as sister has this and they are checking; she is ok now    Dietary issues and exercise activities discussed: Current  Exercise Habits: Home exercise routine, Time (Minutes): > 60, Frequency (Times/Week): 5, Weekly Exercise (Minutes/Week): 0, Intensity: Moderate  Goals    . Eat more fruits and vegetables          Check out  online nutrition programs as GumSearch.nl and http://vang.com/; fit30me; Look for foods with "whole" wheat; bran; oatmeal etc Shot at the farmer's markets in season for fresher choices  Watch for "hydrogenated" on the label of oils which are trans-fats.  Watch for "high fructose corn syrup" in snacks, yogurt or ketchup  Meats have less marbling; bright colored fruits and vegetables;  Canned; dump out liquid and wash vegetables. Be mindful of what we are eating  Portion control is essential to a health weight! Sit down; take a break and enjoy your meal; take smaller bites; put the fork down between bites;  It takes 20 minutes to get full; so check in with your fullness cues and stop eating when you start to fill full             Depression Screen PHQ 2/9 Scores 11/05/2016 08/11/2016 08/06/2015 03/30/2014  PHQ - 2 Score 0 0 0 0    Fall Risk Fall Risk  11/05/2016 08/11/2016 08/06/2015  03/30/2014  Falls in the past year? No No No No    Cognitive Function: MMSE - Mini Mental State Exam 11/05/2016  Not completed: (No Data)     no issues;     Screening Tests Health Maintenance  Topic Date Due  . Hepatitis C Screening  [birth year]  . MAMMOGRAM  04/13/2016  . INFLUENZA VACCINE  02/11/2017  . TETANUS/TDAP  04/18/2019  . COLONOSCOPY  12/09/2021  . DEXA SCAN  Completed  . 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  Agrees to lose weight  Educated and discussed cholesterol and cutting back on chol in diet   Manuela Schwartz will call and try to get mammogram and dexa for the records, completed at [facility]  Will order Hep c for future draw  During the course of the visit, Natalie [name] was educated and counseled about the following appropriate screening and preventive services:   Vaccines to include Pneumoccal, Influenza, Hepatitis B, Td, Zostavax, HCV  Electrocardiogram  Cardiovascular disease screening  Colorectal cancer screening  Bone density screening  Diabetes screening  Glaucoma screening  Mammography/PAP  Nutrition counseling  Smoking cessation counseling  Patient Instructions (the written plan) were given to the patient.    DGLOV,FIEPP, RN   11/05/2016   Above notes reviewed and agree.  Eulas Post MD [facility]

## 2016-11-05 NOTE — Patient Instructions (Addendum)
Natalie [name] , Thank you for taking time to come for your Medicare Wellness Visit. I appreciate your ongoing commitment to your health goals. Please review the following plan we discussed and let me know if I can assist you in the future.   Recommendations for Dexa Scan Female over the age of 65 If you broke a bone past the age of 77 Women menopausal age with risk factors (thin frame; smoker; hx of fx ) Post menopausal women under the age of 93 with risk factors A man age 83 to 82 with risk factors Other: Spine xray that is showing break of bone loss Back pain with possible break Height loss of 1/2 inch or more within one year Total loss in height of 1.5 inches from your original height  Calcium 1253m with Vit D 800u per day; more as directed by physician Strength building exercises discussed; can include walking; housework; small weights or stretch bands; silver sneakers if access to the [facility] You can explore the osteoporosis foundation.org   These are the goals we discussed: Goals    . Eat more fruits and vegetables          Check out  online nutrition programs as [employer]GumSearch.[place] mhttp://vang.com/ fit225m Look for foods with "whole" wheat; bran; oatmeal etc Shot at the farmer's markets in season for fresher choices  Watch for "hydrogenated" on the label of oils which are trans-fats.  Watch for "high fructose corn syrup" in snacks, yogurt or ketchup  Meats have less marbling; bright colored fruits and vegetables;  Canned; dump out liquid and wash vegetables. Be mindful of what we are eating  Portion control is essential to a health weight! Sit down; take a break and enjoy your meal; take smaller bites; put the fork down between bites;  It takes 20 minutes to get full; so check in with your fullness cues and stop eating when you start to fill full              This is a list of the screening recommended for you and due dates:  Health Maintenance  Topic Date Due  .   Hepatitis C: One time screening is recommended by Center for Disease Control  (CDC) for  adults born from 1935hrough 1963.   [id number]. Mammogram  04/13/2016  . Flu Shot  02/11/2017  . Tetanus Vaccine  04/18/2019  . Colon Cancer Screening  12/09/2021  . DEXA scan (bone density measurement)  Completed  . Pneumonia vaccines  Completed     Fat and Cholesterol Restricted Diet Getting too much fat and cholesterol in your diet may cause health problems. Following this diet helps keep your fat and cholesterol at normal levels. This can keep you from getting sick. What types of fat should I choose?  Choose monosaturated and polyunsaturated fats. These are found in foods such as olive oil, canola oil, flaxseeds, walnuts, almonds, and seeds.  Eat more omega-3 fats. Good choices include salmon, mackerel, sardines, tuna, flaxseed oil, and ground flaxseeds.  Limit saturated fats. These are in animal products such as meats, butter, and cream. They can also be in plant products such as palm oil, palm kernel oil, and coconut oil.  Avoid foods with partially hydrogenated oils in them. These contain trans fats. Examples of foods that have trans fats are stick margarine, some tub margarines, cookies, crackers, and other baked goods. What general guidelines do I need to follow?  Check food labels. Look for the words "trans  fat" and "saturated fat."  When preparing a meal:  Fill half of your plate with vegetables and green salads.  Fill one fourth of your plate with whole grains. Look for the word "whole" as the first word in the ingredient list.  Fill one fourth of your plate with lean protein foods.  Eat more foods that have fiber, like apples, carrots, beans, peas, and barley.  Eat more home-cooked foods. Eat less at restaurants and buffets.  Limit or avoid alcohol.  Limit foods high in starch and sugar.  Limit fried foods.  Cook foods without frying them. Baking, boiling, grilling, and  broiling are all great options.  Lose weight if you are overweight. Losing even a small amount of weight can help your overall health. It can also help prevent diseases such as diabetes and heart disease. What foods can I eat? Grains  Whole grains, such as whole wheat or whole grain breads, crackers, cereals, and pasta. Unsweetened oatmeal, bulgur, barley, quinoa, or brown rice. Corn or whole wheat flour tortillas. Vegetables  Fresh or frozen vegetables (raw, steamed, roasted, or grilled). Green salads. Fruits  All fresh, canned (in natural juice), or frozen fruits. Meat and Other Protein Products  Ground beef (85% or leaner), grass-fed beef, or beef trimmed of fat. Skinless chicken or [place]. Ground chicken or [place]. Pork trimmed of fat. All fish and seafood. Eggs. Dried beans, peas, or lentils. Unsalted nuts or seeds. Unsalted canned or dry beans. Dairy  Low-fat dairy products, such as skim or 1% milk, 2% or reduced-fat cheeses, low-fat ricotta or cottage cheese, or plain low-fat yogurt. Fats and Oils  Tub margarines without trans fats. Light or reduced-fat mayonnaise and salad dressings. Avocado. Olive, canola, sesame, or safflower oils. Natural peanut or almond butter (choose ones without added sugar and oil). The items listed above may not be a complete list of recommended foods or beverages. Contact your dietitian for more options.  What foods are not recommended? Grains  White bread. White pasta. White rice. Cornbread. Bagels, pastries, and croissants. Crackers that contain trans fat. Vegetables  White potatoes. Corn. Creamed or fried vegetables. Vegetables in a cheese sauce. Fruits  Dried fruits. Canned fruit in light or heavy syrup. Fruit juice. Meat and Other Protein Products  Fatty cuts of meat. Ribs, chicken wings, bacon, sausage, bologna, salami, chitterlings, fatback, hot dogs, bratwurst, and packaged luncheon meats. Liver and organ meats. Dairy  Whole or 2% milk, cream,  half-and-half, and cream cheese. Whole milk cheeses. Whole-fat or sweetened yogurt. Full-fat cheeses. Nondairy creamers and whipped toppings. Processed cheese, cheese spreads, or cheese curds. Sweets and Desserts  Corn syrup, sugars, honey, and molasses. Candy. Jam and jelly. Syrup. Sweetened cereals. Cookies, pies, cakes, donuts, muffins, and ice cream. Fats and Oils  Butter, stick margarine, lard, shortening, ghee, or bacon fat. Coconut, palm kernel, or palm oils. Beverages  Alcohol. Sweetened drinks (such as sodas, lemonade, and fruit drinks or punches). The items listed above may not be a complete list of foods and beverages to avoid. Contact your dietitian for more information.  This information is not intended to replace advice given to you by your health care provider. Make sure you discuss any questions you have with your health care provider. Document Released: 12/30/2011 Document Revised: 03/06/2016 Document Reviewed: 09/29/2013 Elsevier Interactive Patient Education  2017 [place] in the Home Falls can cause injuries. They can happen to people of all ages. There are many things you can do  to make your home safe and to help prevent falls. What can I do on the outside of my home?  Regularly fix the edges of walkways and driveways and fix any cracks.  Remove anything that might make you trip as you walk through a door, such as a raised step or threshold.  Trim any bushes or trees on the path to your home.  Use bright outdoor lighting.  Clear any walking paths of anything that might make someone trip, such as rocks or tools.  Regularly check to see if handrails are loose or broken. Make sure that both sides of any steps have handrails.  Any raised decks and porches should have guardrails on the edges.  Have any leaves, snow, or ice cleared regularly.  Use sand or salt on walking paths during winter.  Clean up any spills in your garage right away.  This includes oil or grease spills. What can I do in the bathroom?  Use night lights.  Install grab bars by the toilet and in the tub and shower. Do not use towel bars as grab bars.  Use non-skid mats or decals in the tub or shower.  If you need to sit down in the shower, use a plastic, non-slip stool.  Keep the floor dry. Clean up any water that spills on the floor as soon as it happens.  Remove soap buildup in the tub or shower regularly.  Attach bath mats securely with double-sided non-slip rug tape.  Do not have throw rugs and other things on the floor that can make you trip. What can I do in the bedroom?  Use night lights.  Make sure that you have a light by your bed that is easy to reach.  Do not use any sheets or blankets that are too big for your bed. They should not hang down onto the floor.  Have a firm chair that has side arms. You can use this for support while you get dressed.  Do not have throw rugs and other things on the floor that can make you trip. What can I do in the kitchen?  Clean up any spills right away.  Avoid walking on wet floors.  Keep items that you use a lot in easy-to-reach places.  If you need to reach something above you, use a strong step stool that has a grab bar.  Keep electrical cords out of the way.  Do not use floor polish or wax that makes floors slippery. If you must use wax, use non-skid floor wax.  Do not have throw rugs and other things on the floor that can make you trip. What can I do with my stairs?  Do not leave any items on the stairs.  Make sure that there are handrails on both sides of the stairs and use them. Fix handrails that are broken or loose. Make sure that handrails are as long as the stairways.  Check any carpeting to make sure that it is firmly attached to the stairs. Fix any carpet that is loose or worn.  Avoid having throw rugs at the top or bottom of the stairs. If you do have throw rugs, attach them  to the floor with carpet tape.  Make sure that you have a light switch at the top of the stairs and the bottom of the stairs. If you do not have them, ask someone to add them for you. What else can I do to help prevent falls?  Wear shoes that:  Do not have high heels.  Have rubber bottoms.  Are comfortable and fit you well.  Are closed at the toe. Do not wear sandals.  If you use a stepladder:  Make sure that it is fully opened. Do not climb a closed stepladder.  Make sure that both sides of the stepladder are locked into place.  Ask someone to hold it for you, if possible.  Clearly mark and make sure that you can see:  Any grab bars or handrails.  First and last steps.  Where the edge of each step is.  Use tools that help you move around (mobility aids) if they are needed. These include:  Canes.  Walkers.  Scooters.  Crutches.  Turn on the lights when you go into a dark area. Replace any light bulbs as soon as they burn out.  Set up your furniture so you have a clear path. Avoid moving your furniture around.  If any of your floors are uneven, fix them.  If there are any pets around you, be aware of where they are.  Review your medicines with your doctor. Some medicines can make you feel dizzy. This can increase your chance of falling. Ask your doctor what other things that you can do to help prevent falls. This information is not intended to replace advice given to you by your health care provider. Make sure you discuss any questions you have with your health care provider. Document Released: 04/26/2009 Document Revised: 12/06/2015 Document Reviewed: 08/04/2014 Elsevier Interactive Patient Education  2017 [place] Maintenance, Female Adopting a healthy lifestyle and getting preventive care can go a long way to promote health and wellness. Talk with your health care provider about what schedule of regular examinations is right for you. This is a  good chance for you to check in with your provider about disease prevention and staying healthy. In between checkups, there are plenty of things you can do on your own. Experts have done a lot of research about which lifestyle changes and preventive measures are most likely to keep you healthy. Ask your health care provider for more information. Weight and diet Eat a healthy diet  Be sure to include plenty of vegetables, fruits, low-fat dairy products, and lean protein.  Do not eat a lot of foods high in solid fats, added sugars, or salt.  Get regular exercise. This is one of the most important things you can do for your health.  Most adults should exercise for at least 150 minutes each week. The exercise should increase your heart rate and make you sweat (moderate-intensity exercise).  Most adults should also do strengthening exercises at least twice a week. This is in addition to the moderate-intensity exercise. Maintain a healthy weight  Body mass index (BMI) is a measurement that can be used to identify possible weight problems. It estimates body fat based on height and weight. Your health care provider can help determine your BMI and help you achieve or maintain a healthy weight.  For females 36 years of age and older:  A BMI below 18.5 is considered underweight.  A BMI of 18.5 to 24.9 is normal.  A BMI of 25 to 29.9 is considered overweight.  A BMI of 30 and above is considered obese. Watch levels of cholesterol and blood lipids  You should start having your blood tested for lipids and cholesterol at 69 years of age, then have this test every 5 years.  You may need to have your  cholesterol levels checked more often if:  Your lipid or cholesterol levels are high.  You are older than 69 years of age.  You are at high risk for heart disease. Cancer screening Lung Cancer  Lung cancer screening is recommended for adults 66-20 years old who are at high risk for lung cancer  because of a history of smoking.  A yearly low-dose CT scan of the lungs is recommended for people who:  Currently smoke.  Have quit within the past 15 years.  Have at least a 30-pack-year history of smoking. A pack year is smoking an average of one pack of cigarettes a day for 1 year.  Yearly screening should continue until it has been 15 years since you quit.  Yearly screening should stop if you develop a health problem that would prevent you from having lung cancer treatment. Breast Cancer  Practice breast self-awareness. This means understanding how your breasts normally appear and feel.  It also means doing regular breast self-exams. Let your health care provider know about any changes, no matter how small.  If you are in your 20s or 30s, you should have a clinical breast exam (CBE) by a health care provider every 1-3 years as part of a regular health exam.  If you are 1 or older, have a CBE every year. Also consider having a breast X-ray (mammogram) every year.  If you have a family history of breast cancer, talk to your health care provider about genetic screening.  If you are at high risk for breast cancer, talk to your health care provider about having an MRI and a mammogram every year.  Breast cancer gene (BRCA) assessment is recommended for women who have family members with BRCA-related cancers. BRCA-related cancers include:  Breast.  Ovarian.  Tubal.  Peritoneal cancers.  Results of the assessment will determine the need for genetic counseling and BRCA1 and BRCA2 testing. Cervical Cancer  Your health care provider may recommend that you be screened regularly for cancer of the pelvic organs (ovaries, uterus, and vagina). This screening involves a pelvic examination, including checking for microscopic changes to the surface of your cervix (Pap test). You may be encouraged to have this screening done every 3 years, beginning at age 47.  For women ages 46-65, health  care providers may recommend pelvic exams and Pap testing every 3 years, or they may recommend the Pap and pelvic exam, combined with testing for human papilloma virus (HPV), every 5 years. Some types of HPV increase your risk of cervical cancer. Testing for HPV may also be done on women of any age with unclear Pap test results.  Other health care providers may not recommend any screening for nonpregnant women who are considered low risk for pelvic cancer and who do not have symptoms. Ask your health care provider if a screening pelvic exam is right for you.  If you have had past treatment for cervical cancer or a condition that could lead to cancer, you need Pap tests and screening for cancer for at least 20 years after your treatment. If Pap tests have been discontinued, your risk factors (such as having a new sexual partner) need to be reassessed to determine if screening should resume. Some women have medical problems that increase the chance of getting cervical cancer. In these cases, your health care provider may recommend more frequent screening and Pap tests. Colorectal Cancer  This type of cancer can be detected and often prevented.  Routine colorectal cancer screening usually  begins at 69 years of age and continues through 69 years of age.  Your health care provider may recommend screening at an earlier age if you have risk factors for colon cancer.  Your health care provider may also recommend using home test kits to check for hidden blood in the stool.  A small camera at the end of a tube can be used to examine your colon directly (sigmoidoscopy or colonoscopy). This is done to check for the earliest forms of colorectal cancer.  Routine screening usually begins at age 32.  Direct examination of the colon should be repeated every 5-10 years through 69 years of age. However, you may need to be screened more often if early forms of precancerous polyps or small growths are found. Skin  Cancer  Check your skin from head to toe regularly.  Tell your health care provider about any new moles or changes in moles, especially if there is a change in a mole's shape or color.  Also tell your health care provider if you have a mole that is larger than the size of a pencil eraser.  Always use sunscreen. Apply sunscreen liberally and repeatedly throughout the day.  Protect yourself by wearing long sleeves, pants, a wide-brimmed hat, and sunglasses whenever you are outside. Heart disease, diabetes, and high blood pressure  High blood pressure causes heart disease and increases the risk of stroke. High blood pressure is more likely to develop in:  People who have blood pressure in the high end of the normal range (130-139/85-89 mm Hg).  People who are overweight or obese.  People who are African American.  If you are 6-43 years of age, have your blood pressure checked every 3-5 years. If you are 64 years of age or older, have your blood pressure checked every year. You should have your blood pressure measured twice-once when you are at a hospital or clinic, and once when you are not at a hospital or clinic. Record the average of the two measurements. To check your blood pressure when you are not at a hospital or clinic, you can use:  An automated blood pressure machine at a pharmacy.  A home blood pressure monitor.  If you are between 18 years and 18 years old, ask your health care provider if you should take aspirin to prevent strokes.  Have regular diabetes screenings. This involves taking a blood sample to check your fasting blood sugar level.  If you are at a normal weight and have a low risk for diabetes, have this test once every three years after 69 years of age.  If you are overweight and have a high risk for diabetes, consider being tested at a younger age or more often. Preventing infection Hepatitis B  If you have a higher risk for hepatitis B, you should be  screened for this virus. You are considered at high risk for hepatitis B if:  You were born in a country where hepatitis B is common. Ask your health care provider which countries are considered high risk.  Your parents were born in a high-risk country, and you have not been immunized against hepatitis B (hepatitis B vaccine).  You have HIV or AIDS.  You use needles to inject street drugs.  You live with someone who has hepatitis B.  You have had sex with someone who has hepatitis B.  You get hemodialysis treatment.  You take certain medicines for conditions, including cancer, organ transplantation, and autoimmune conditions. Hepatitis C  Blood testing is recommended for:  Everyone born from [birth year] through 1963.  Anyone with known risk factors for hepatitis C. Sexually transmitted infections (STIs)  You should be screened for sexually transmitted infections (STIs) including gonorrhea and chlamydia if:  You are sexually active and are younger than 69 years of age.  You are older than 69 years of age and your health care provider tells you that you are at risk for this type of infection.  Your sexual activity has changed since you were last screened and you are at an increased risk for chlamydia or gonorrhea. Ask your health care provider if you are at risk.  If you do not have HIV, but are at risk, it may be recommended that you take a prescription medicine daily to prevent HIV infection. This is called pre-exposure prophylaxis (PrEP). You are considered at risk if:  You are sexually active and do not regularly use condoms or know the HIV status of your partner(s).  You take drugs by injection.  You are sexually active with a partner who has HIV. Talk with your health care provider about whether you are at high risk of being infected with HIV. If you choose to begin PrEP, you should first be tested for HIV. You should then be tested every 3 months for as long as you are taking  PrEP. Pregnancy  If you are premenopausal and you may become pregnant, ask your health care provider about preconception counseling.  If you may become pregnant, take 400 to 800 micrograms (mcg) of folic acid every day.  If you want to prevent pregnancy, talk to your health care provider about birth control (contraception). Osteoporosis and menopause  Osteoporosis is a disease in which the bones lose minerals and strength with aging. This can result in serious bone fractures. Your risk for osteoporosis can be identified using a bone density scan.  If you are 60 years of age or older, or if you are at risk for osteoporosis and fractures, ask your health care provider if you should be screened.  Ask your health care provider whether you should take a calcium or vitamin D supplement to lower your risk for osteoporosis.  Menopause may have certain physical symptoms and risks.  Hormone replacement therapy may reduce some of these symptoms and risks. Talk to your health care provider about whether hormone replacement therapy is right for you. Follow these instructions at home:  Schedule regular health, dental, and eye exams.  Stay current with your immunizations.  Do not use any tobacco products including cigarettes, chewing tobacco, or electronic cigarettes.  If you are pregnant, do not drink alcohol.  If you are breastfeeding, limit how much and how often you drink alcohol.  Limit alcohol intake to no more than 1 drink per day for nonpregnant women. One drink equals 12 ounces of beer, 5 ounces of wine, or 1 ounces of hard liquor.  Do not use street drugs.  Do not share needles.  Ask your health care provider for help if you need support or information about quitting drugs.  Tell your health care provider if you often feel depressed.  Tell your health care provider if you have ever been abused or do not feel safe at home. This information is not intended to replace advice given  to you by your health care provider. Make sure you discuss any questions you have with your health care provider. Document Released: 01/13/2011 Document Revised: 12/06/2015 Document Reviewed: 04/03/2015 Elsevier Interactive Patient Education  2017 [place].

## 2016-11-06 NOTE — Progress Notes (Unsigned)
Call to [facility] Confirmed mammogram completed Jul 25, 2016 and was normal

## 2016-11-10 ENCOUNTER — Telehealth: Payer: Self-pay | Admitting: Family Medicine

## 2016-11-10 NOTE — Telephone Encounter (Signed)
Pt given rx in Jan for 1 year.  Called and spoke to [place] at the pharmacy.  Prescription is on file.  They will get rx ready for the pt to pick up.  Called to notify the pt that she had refills on file.  Pharmacy is getting ready for her to pick up.  She wanted to know if the pharmacy will be filling a partial prescription until she hears about her lab results.  Notified pt that she may call the pharmacy to request a partial refill.  She then wanted to know if she should stop the medication until she hears about lab results.  Informed her that she should never stop a long term medication unless instructed by the physician.

## 2016-11-10 NOTE — Telephone Encounter (Signed)
Pt need new Rx for levothyroxine  Pharm:  [facility] on [address]  Pt is out of medication and is very concern if she should continue to take the generic medication due to not hearing anything about her labs.  Pt would like to have a call back if this is going to be called in.

## 2017-03-18 ENCOUNTER — Other Ambulatory Visit (INDEPENDENT_AMBULATORY_CARE_PROVIDER_SITE_OTHER): Payer: Medicare HMO

## 2017-03-18 DIAGNOSIS — E039 Hypothyroidism, unspecified: Secondary | ICD-10-CM

## 2017-03-18 LAB — TSH: TSH: 0.38 u[IU]/mL (ref 0.35–4.50)

## 2017-03-30 ENCOUNTER — Other Ambulatory Visit: Payer: Medicare HMO

## 2017-04-22 ENCOUNTER — Ambulatory Visit (INDEPENDENT_AMBULATORY_CARE_PROVIDER_SITE_OTHER): Payer: Medicare HMO

## 2017-04-22 DIAGNOSIS — Z23 Encounter for immunization: Secondary | ICD-10-CM

## 2017-06-01 ENCOUNTER — Other Ambulatory Visit: Payer: Self-pay | Admitting: Dermatology

## 2017-06-01 DIAGNOSIS — L988 Other specified disorders of the skin and subcutaneous tissue: Secondary | ICD-10-CM | POA: Diagnosis not present

## 2017-06-01 DIAGNOSIS — L989 Disorder of the skin and subcutaneous tissue, unspecified: Secondary | ICD-10-CM | POA: Diagnosis not present

## 2017-06-14 DIAGNOSIS — I10 Essential (primary) hypertension: Secondary | ICD-10-CM | POA: Diagnosis not present

## 2017-06-14 DIAGNOSIS — S61209A Unspecified open wound of unspecified finger without damage to nail, initial encounter: Secondary | ICD-10-CM | POA: Diagnosis not present

## 2017-06-14 DIAGNOSIS — E039 Hypothyroidism, unspecified: Secondary | ICD-10-CM | POA: Diagnosis not present

## 2017-08-10 DIAGNOSIS — Z01419 Encounter for gynecological examination (general) (routine) without abnormal findings: Secondary | ICD-10-CM | POA: Diagnosis not present

## 2017-08-10 DIAGNOSIS — Z1231 Encounter for screening mammogram for malignant neoplasm of breast: Secondary | ICD-10-CM | POA: Diagnosis not present

## 2017-08-12 ENCOUNTER — Other Ambulatory Visit: Payer: Self-pay | Admitting: Obstetrics and Gynecology

## 2017-08-12 ENCOUNTER — Ambulatory Visit: Payer: Medicare HMO | Admitting: Family Medicine

## 2017-08-17 ENCOUNTER — Ambulatory Visit: Payer: Medicare HMO | Admitting: Family Medicine

## 2017-08-18 ENCOUNTER — Encounter: Payer: Self-pay | Admitting: Family Medicine

## 2017-08-18 ENCOUNTER — Ambulatory Visit (INDEPENDENT_AMBULATORY_CARE_PROVIDER_SITE_OTHER): Payer: Medicare HMO | Admitting: Family Medicine

## 2017-08-18 VITALS — BP 124/82 | HR 82 | Temp 98.1°F | Ht 64.0 in | Wt 134.0 lb

## 2017-08-18 DIAGNOSIS — Z1159 Encounter for screening for other viral diseases: Secondary | ICD-10-CM | POA: Diagnosis not present

## 2017-08-18 DIAGNOSIS — I498 Other specified cardiac arrhythmias: Secondary | ICD-10-CM | POA: Diagnosis not present

## 2017-08-18 DIAGNOSIS — Z0000 Encounter for general adult medical examination without abnormal findings: Secondary | ICD-10-CM

## 2017-08-18 DIAGNOSIS — G43101 Migraine with aura, not intractable, with status migrainosus: Secondary | ICD-10-CM

## 2017-08-18 DIAGNOSIS — E039 Hypothyroidism, unspecified: Secondary | ICD-10-CM

## 2017-08-18 LAB — POCT URINALYSIS DIPSTICK
BILIRUBIN UA: NEGATIVE
GLUCOSE UA: NEGATIVE
KETONES UA: NEGATIVE
Nitrite, UA: NEGATIVE
PH UA: 6 (ref 5.0–8.0)
Protein, UA: NEGATIVE
Spec Grav, UA: 1.015 (ref 1.010–1.025)
UROBILINOGEN UA: 0.2 U/dL

## 2017-08-18 LAB — BASIC METABOLIC PANEL
BUN: 15 mg/dL (ref 6–23)
CHLORIDE: 105 meq/L (ref 96–112)
CO2: 29 meq/L (ref 19–32)
Calcium: 8.9 mg/dL (ref 8.4–10.5)
Creatinine, Ser: 0.73 mg/dL (ref 0.40–1.20)
GFR: 83.79 mL/min (ref >60.00)
GLUCOSE: 94 mg/dL (ref 70–99)
POTASSIUM: 4.2 meq/L (ref 3.5–5.1)
SODIUM: 141 meq/L (ref 135–145)

## 2017-08-18 LAB — CBC WITH DIFFERENTIAL/PLATELET
BASOS PCT: 0.4 % (ref 0.0–3.0)
Basophils Absolute: 0 10*3/uL (ref 0.0–0.1)
EOS ABS: 0 10*3/uL (ref 0.0–0.7)
EOS PCT: 0.4 % (ref 0.0–5.0)
HEMATOCRIT: 37.7 % (ref 36.0–46.0)
Hemoglobin: 12.7 g/dL (ref 12.0–15.0)
Lymphocytes Relative: 22.2 % (ref 12.0–46.0)
Lymphs Abs: 1.8 10*3/uL (ref 0.7–4.0)
MCHC: 33.7 g/dL (ref 30.0–36.0)
MCV: 91 fl (ref 78.0–100.0)
Monocytes Absolute: 0.7 10*3/uL (ref 0.1–1.0)
Monocytes Relative: 8.4 % (ref 3.0–12.0)
NEUTROS ABS: 5.5 10*3/uL (ref 1.4–7.7)
Neutrophils Relative %: 68.6 % (ref 43.0–77.0)
PLATELETS: 284 10*3/uL (ref 150.0–400.0)
RBC: 4.15 Mil/uL (ref 3.87–5.11)
RDW: 13.3 % (ref 11.5–15.5)
WBC: 8 10*3/uL (ref 4.0–10.5)

## 2017-08-18 LAB — HEPATIC FUNCTION PANEL
ALT: 13 U/L (ref 0–35)
AST: 13 U/L (ref 0–37)
Albumin: 4 g/dL (ref 3.5–5.2)
Alkaline Phosphatase: 76 U/L (ref 39–117)
BILIRUBIN DIRECT: 0.1 mg/dL (ref 0.0–0.3)
Total Bilirubin: 0.6 mg/dL (ref 0.2–1.2)
Total Protein: 7.2 g/dL (ref 6.0–8.3)

## 2017-08-18 LAB — LIPID PANEL
CHOLESTEROL: 198 mg/dL (ref 0–200)
HDL: 56.8 mg/dL (ref >39.00)
LDL CALC: 120 mg/dL — AB (ref 0–99)
NONHDL: 141.13
Total CHOL/HDL Ratio: 3
Triglycerides: 108 mg/dL (ref 0.0–149.0)
VLDL: 21.6 mg/dL (ref 0.0–40.0)

## 2017-08-18 LAB — TSH: TSH: 0.49 u[IU]/mL (ref 0.35–4.50)

## 2017-08-18 MED ORDER — METOPROLOL SUCCINATE ER 25 MG PO TB24: 25.0000 mg | ORAL_TABLET | Freq: Every day | ORAL | 4 refills | Status: DC

## 2017-08-18 MED ORDER — LEVOTHYROXINE SODIUM 75 MCG PO TABS: 75.0000 ug | ORAL_TABLET | Freq: Every day | ORAL | 4 refills | Status: DC

## 2017-08-18 MED ORDER — RIZATRIPTAN BENZOATE 5 MG PO TBDP: 5.0000 mg | ORAL_TABLET | ORAL | 6 refills | Status: DC | PRN

## 2017-08-18 NOTE — Progress Notes (Signed)
Natalie [name] is a delightful 72 year old single female nonsmoker who comes in today for general physical examination because of a history of a tachycardia arrhythmia, migraine headaches, hypo-thyroidism  For hypothyroidism and takes Synthroid 75 g daily. Should part of her thyroid removed years ago  She takes Toprol 25 mg daily because of a history of PAT. On medication she is asymptomatic  She takes Maxalt 5 mg when necessary for migraines. In the last 12 months she's had no migraine headaches.  She gets routine eye care, dental care, BSE monthly, annual mammography, colonoscopy 2013 was normal  Vaccinations up-to-date information given on shingles.  She had a GYN exam which included a Pap smear year ago at her gynecologist office. That was all normal. She's never had any GYN abnormalities.  She saw Dr. Allyson Sabal her dermatologist before he retired. She has light skin and light eyes and a history of abnormal dysplastic type lesions.  Social history...........Marland Kitchen single lives here in [place]. 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 2 children here in town and also grandchildren and brothers and sisters  Recommended she do a healthcare power of attorney and living well.  Cognitive function normal she exercises daily home health safety reviewed no issues identified, no guns in the house, as noted above she's not on a healthcare power of attorney nor living will.  BP 124/82 (BP Location: Left Arm, Patient Position: Sitting, Cuff Size: Normal)   Pulse 82   Temp 98.1 F (36.7 C) (Oral)   Ht 5\' 4"  (1.626 m)   Wt 134 lb (60.8 kg)   BMI 23.00 kg/m  Well-developed well-nourished female no acute distress vital signs stable she's afebrile examination of the HEENT were negative neck was supple no adenopathy no bruits. Scar midline neck from previous thyroidectomy. Portions of both thyroid gland still remain. There is diffuse and enlarged. Ultrasound done 2 years ago showed no  significant change over time  Cardiopulmonary exam normal breast exam normal abdominal exam normal pelvic and rectal by GYN therefore deferred. Extremities normal skin normal peripheral pulses normal  #1 healthy female  #2 status post thyroidectomy with multinodular goiter....... continue Synthroid for suppression  #3 history of tach arrhythmia........ continue beta blocker daily  #4 migraine headaches.........Marland Kitchen marked decrease in frequency and severity...Marland KitchenMarland KitchenMarland Kitchen refill Maxalt

## 2017-08-18 NOTE — Patient Instructions (Signed)
Labs today........ I will call you if there is anything abnormal  Continue current meds  Cardiac insurance company finally get the shingles vaccine  Return in one year for general physical exam sooner if any problem

## 2017-08-18 NOTE — Addendum Note (Signed)
Addended by: Tomi Likens on: 08/18/2017 11:56 AM   Modules accepted: Orders

## 2017-08-19 ENCOUNTER — Other Ambulatory Visit: Payer: Self-pay | Admitting: Obstetrics and Gynecology

## 2017-08-19 DIAGNOSIS — R5381 Other malaise: Secondary | ICD-10-CM

## 2017-08-19 LAB — HEPATITIS C ANTIBODY
HEP C AB: NONREACTIVE
SIGNAL TO CUT-OFF: 0.02 (ref <1.00)

## 2017-08-24 ENCOUNTER — Other Ambulatory Visit: Payer: Self-pay | Admitting: Obstetrics and Gynecology

## 2017-08-24 DIAGNOSIS — E2839 Other primary ovarian failure: Secondary | ICD-10-CM

## 2017-10-21 ENCOUNTER — Ambulatory Visit (INDEPENDENT_AMBULATORY_CARE_PROVIDER_SITE_OTHER): Payer: Medicare HMO | Admitting: Family Medicine

## 2017-10-21 ENCOUNTER — Encounter: Payer: Self-pay | Admitting: Family Medicine

## 2017-10-21 VITALS — BP 120/80 | HR 88 | Temp 99.1°F | Wt 130.0 lb

## 2017-10-21 DIAGNOSIS — J0100 Acute maxillary sinusitis, unspecified: Secondary | ICD-10-CM | POA: Diagnosis not present

## 2017-10-21 MED ORDER — AMOXICILLIN 500 MG PO CAPS: 500.0000 mg | ORAL_CAPSULE | Freq: Two times a day (BID) | ORAL | 0 refills | Status: AC

## 2017-10-21 NOTE — Progress Notes (Signed)
Subjective:    Patient ID: Natalie [name], female    DOB: [birth year], 72 y.o.   MRN: [id number]  Chief Complaint  Patient presents with  . Cough    HPI Patient was seen today for ongoing concern.  Patient endorses cough, nasal drainage times 5 days which then turned into burning in her nose, eye pain, teeth pain, feeling like her ears are "stuffy", slight headache.  Patient has only tried Hall's cough drops for her symptoms and she is afraid to take anything else with her h/o hypothyroidism.  Patient endorses visiting the hospital recently as her brother was sick.  She also endorses being in close contact with other family members who smoke which caused irritation.  Patient denies fever, chills, nausea, vomiting.  Past Medical History:  Diagnosis Date  . Thyroid disease    Goiter    No Known Allergies  ROS General: Denies fever, chills, night sweats, changes in weight, changes in appetite HEENT: Denies ear pain, changes in vision, rhinorrhea, sore throat  +facial pressure, eye pain, slight headache, stuffy ears, burning nose, drainage CV: Denies CP, palpitations, SOB, orthopnea Pulm: Denies SOB, wheezing  +cough-resolving GI: Denies abdominal pain, nausea, vomiting, diarrhea, constipation GU: Denies dysuria, hematuria, frequency, vaginal discharge Msk: Denies muscle cramps, joint pains Neuro: Denies weakness, numbness, tingling Skin: Denies rashes, bruising Psych: Denies depression, anxiety, hallucinations     Objective:    Blood pressure 120/80, pulse 88, temperature 99.1 F (37.3 C), temperature source Oral, weight 130 lb (59 kg), SpO2 98 %.   Gen. Pleasant, well-nourished, in no distress, normal affect  HEENT: [place]/AT, face symmetric,no scleral icterus, PERRLA, nares patent with erythema and mild drainage, pharynx without erythema or exudate.  TMs full bilaterally.  No cervical lymphadenopathy.  TTP of maxillary sinuses. Lungs: no accessory muscle use, CTAB, no wheezes or  rales Cardiovascular: RRR, no m/r/g, no peripheral edema Abdomen: BS present, soft, NT/ND Skin:  Warm, no lesions/ rash   Wt Readings from Last 3 Encounters:  10/21/17 130 lb (59 kg)  08/18/17 134 lb (60.8 kg)  11/05/16 139 lb (63 kg)    Lab Results  Component Value Date   WBC 8.0 08/18/2017   HGB 12.7 08/18/2017   HCT 37.7 08/18/2017   PLT 284.0 08/18/2017   GLUCOSE 94 08/18/2017   CHOL 198 08/18/2017   TRIG 108.0 08/18/2017   HDL 56.80 08/18/2017   LDLDIRECT 166.8 04/21/2013   LDLCALC 120 (H) 08/18/2017   ALT 13 08/18/2017   AST 13 08/18/2017   NA 141 08/18/2017   K 4.2 08/18/2017   CL 105 08/18/2017   CREATININE 0.73 08/18/2017   BUN 15 08/18/2017   CO2 29 08/18/2017   TSH 0.49 08/18/2017    Assessment/Plan:  Acute non-recurrent maxillary sinusitis  -Okay to continue using saline nasal spray. -Patient can use ibuprofen or Tylenol as needed for any pain/discomfort -Handout given - Plan: amoxicillin (AMOXIL) 500 MG capsule -Follow-up PRN for worsening or continued symptoms.  Grier Mitts, MD

## 2017-10-21 NOTE — Patient Instructions (Addendum)
You can continue using the saline nasal spray as needed.  You can also take Tylenol or ibuprofen for any pain/discomfort. Sinusitis, Adult Sinusitis is soreness and inflammation of your sinuses. Sinuses are hollow spaces in the bones around your face. Your sinuses are located:  Around your eyes.  In the middle of your forehead.  Behind your nose.  In your cheekbones.  Your sinuses and nasal passages are lined with a stringy fluid (mucus). Mucus normally drains out of your sinuses. When your nasal tissues become inflamed or swollen, the mucus can become trapped or blocked so air cannot flow through your sinuses. This allows bacteria, viruses, and funguses to grow, which leads to infection. Sinusitis can develop quickly and last for 7?10 days (acute) or for more than 12 weeks (chronic). Sinusitis often develops after a cold. What are the causes? This condition is caused by anything that creates swelling in the sinuses or stops mucus from draining, including:  Allergies.  Asthma.  Bacterial or viral infection.  Abnormally shaped bones between the nasal passages.  Nasal growths that contain mucus (nasal polyps).  Narrow sinus openings.  Pollutants, such as chemicals or irritants in the air.  A foreign object stuck in the nose.  A fungal infection. This is rare.  What increases the risk? The following factors may make you more likely to develop this condition:  Having allergies or asthma.  Having had a recent cold or respiratory tract infection.  Having structural deformities or blockages in your nose or sinuses.  Having a weak immune system.  Doing a lot of swimming or diving.  Overusing nasal sprays.  Smoking.  What are the signs or symptoms? The main symptoms of this condition are pain and a feeling of pressure around the affected sinuses. Other symptoms include:  Upper toothache.  Earache.  Headache.  Bad breath.  Decreased sense of smell and taste.  A  cough that may get worse at night.  Fatigue.  Fever.  Thick drainage from your nose. The drainage is often green and it may contain pus (purulent).  Stuffy nose or congestion.  Postnasal drip. This is when extra mucus collects in the throat or back of the nose.  Swelling and warmth over the affected sinuses.  Sore throat.  Sensitivity to light.  How is this diagnosed? This condition is diagnosed based on symptoms, a medical history, and a physical exam. To find out if your condition is acute or chronic, your health care provider may:  Look in your nose for signs of nasal polyps.  Tap over the affected sinus to check for signs of infection.  View the inside of your sinuses using an imaging device that has a light attached (endoscope).  If your health care provider suspects that you have chronic sinusitis, you may also:  Be tested for allergies.  Have a sample of mucus taken from your nose (nasal culture) and checked for bacteria.  Have a mucus sample examined to see if your sinusitis is related to an allergy.  If your sinusitis does not respond to treatment and it lasts longer than 8 weeks, you may have an MRI or CT scan to check your sinuses. These scans also help to determine how severe your infection is. In rare cases, a bone biopsy may be done to rule out more serious types of fungal sinus disease. How is this treated? Treatment for sinusitis depends on the cause and whether your condition is chronic or acute. If a virus is causing your  sinusitis, your symptoms will go away on their own within 10 days. You may be given medicines to relieve your symptoms, including:  Topical nasal decongestants. They shrink swollen nasal passages and let mucus drain from your sinuses.  Antihistamines. These drugs block inflammation that is triggered by allergies. This can help to ease swelling in your nose and sinuses.  Topical nasal corticosteroids. These are nasal sprays that ease  inflammation and swelling in your nose and sinuses.  Nasal saline washes. These rinses can help to get rid of thick mucus in your nose.  If your condition is caused by bacteria, you will be given an antibiotic medicine. If your condition is caused by a fungus, you will be given an antifungal medicine. Surgery may be needed to correct underlying conditions, such as narrow nasal passages. Surgery may also be needed to remove polyps. Follow these instructions at home: Medicines  Take, use, or apply over-the-counter and prescription medicines only as told by your health care provider. These may include nasal sprays.  If you were prescribed an antibiotic medicine, take it as told by your health care provider. Do not stop taking the antibiotic even if you start to feel better. Hydrate and Humidify  Drink enough water to keep your urine clear or pale yellow. Staying hydrated will help to thin your mucus.  Use a cool mist humidifier to keep the humidity level in your home above 50%.  Inhale steam for 10-15 minutes, 3-4 times a day or as told by your health care provider. You can do this in the bathroom while a hot shower is running.  Limit your exposure to cool or dry air. Rest  Rest as much as possible.  Sleep with your head raised (elevated).  Make sure to get enough sleep each night. General instructions  Apply a warm, moist washcloth to your face 3-4 times a day or as told by your health care provider. This will help with discomfort.  Wash your hands often with soap and water to reduce your exposure to viruses and other germs. If soap and water are not available, use hand sanitizer.  Do not smoke. Avoid being around people who are smoking (secondhand smoke).  Keep all follow-up visits as told by your health care provider. This is important. Contact a health care provider if:  You have a fever.  Your symptoms get worse.  Your symptoms do not improve within 10 days. Get help  right away if:  You have a severe headache.  You have persistent vomiting.  You have pain or swelling around your face or eyes.  You have vision problems.  You develop confusion.  Your neck is stiff.  You have trouble breathing. This information is not intended to replace advice given to you by your health care provider. Make sure you discuss any questions you have with your health care provider. Document Released: 06/30/2005 Document Revised: 02/24/2016 Document Reviewed: 04/25/2015 Elsevier Interactive Patient Education  [employer].

## 2017-12-16 DIAGNOSIS — H40023 Open angle with borderline findings, high risk, bilateral: Secondary | ICD-10-CM | POA: Diagnosis not present

## 2017-12-16 DIAGNOSIS — H2513 Age-related nuclear cataract, bilateral: Secondary | ICD-10-CM | POA: Diagnosis not present

## 2017-12-16 DIAGNOSIS — H43812 Vitreous degeneration, left eye: Secondary | ICD-10-CM | POA: Diagnosis not present

## 2017-12-16 DIAGNOSIS — H04123 Dry eye syndrome of bilateral lacrimal glands: Secondary | ICD-10-CM | POA: Diagnosis not present

## 2017-12-16 DIAGNOSIS — H5203 Hypermetropia, bilateral: Secondary | ICD-10-CM | POA: Diagnosis not present

## 2017-12-22 DIAGNOSIS — R69 Illness, unspecified: Secondary | ICD-10-CM | POA: Diagnosis not present

## 2018-04-07 ENCOUNTER — Ambulatory Visit (INDEPENDENT_AMBULATORY_CARE_PROVIDER_SITE_OTHER): Payer: Medicare HMO | Admitting: *Deleted

## 2018-04-07 DIAGNOSIS — Z23 Encounter for immunization: Secondary | ICD-10-CM

## 2018-04-07 NOTE — Progress Notes (Signed)
Per orders of Dr. Sherren Mocha, injection of influenza given by Westley Hummer. Patient tolerated injection well.

## 2018-08-19 ENCOUNTER — Ambulatory Visit (INDEPENDENT_AMBULATORY_CARE_PROVIDER_SITE_OTHER): Payer: Medicare HMO | Admitting: Family Medicine

## 2018-08-19 ENCOUNTER — Encounter: Payer: Self-pay | Admitting: Family Medicine

## 2018-08-19 VITALS — BP 118/74 | HR 66 | Temp 98.0°F | Ht 64.0 in | Wt 134.4 lb

## 2018-08-19 DIAGNOSIS — G43101 Migraine with aura, not intractable, with status migrainosus: Secondary | ICD-10-CM | POA: Diagnosis not present

## 2018-08-19 DIAGNOSIS — Z13220 Encounter for screening for lipoid disorders: Secondary | ICD-10-CM

## 2018-08-19 DIAGNOSIS — Z0001 Encounter for general adult medical examination with abnormal findings: Secondary | ICD-10-CM

## 2018-08-19 DIAGNOSIS — E039 Hypothyroidism, unspecified: Secondary | ICD-10-CM | POA: Diagnosis not present

## 2018-08-19 DIAGNOSIS — R000 Tachycardia, unspecified: Secondary | ICD-10-CM | POA: Diagnosis not present

## 2018-08-19 LAB — COMPREHENSIVE METABOLIC PANEL
ALK PHOS: 87 U/L (ref 39–117)
ALT: 14 U/L (ref 0–35)
AST: 14 U/L (ref 0–37)
Albumin: 4.3 g/dL (ref 3.5–5.2)
BILIRUBIN TOTAL: 0.5 mg/dL (ref 0.2–1.2)
BUN: 14 mg/dL (ref 6–23)
CALCIUM: 9.3 mg/dL (ref 8.4–10.5)
CO2: 28 mEq/L (ref 19–32)
Chloride: 104 mEq/L (ref 96–112)
Creatinine, Ser: 0.77 mg/dL (ref 0.40–1.20)
GFR: 73.91 mL/min (ref >60.00)
GLUCOSE: 87 mg/dL (ref 70–99)
Potassium: 4.1 mEq/L (ref 3.5–5.1)
Sodium: 140 mEq/L (ref 135–145)
TOTAL PROTEIN: 7.6 g/dL (ref 6.0–8.3)

## 2018-08-19 LAB — LIPID PANEL
Cholesterol: 233 mg/dL — ABNORMAL HIGH (ref 0–200)
HDL: 51.2 mg/dL (ref >39.00)
LDL Cholesterol: 150 mg/dL — ABNORMAL HIGH (ref 0–99)
NONHDL: 181.68
TRIGLYCERIDES: 159 mg/dL — AB (ref 0.0–149.0)
Total CHOL/HDL Ratio: 5
VLDL: 31.8 mg/dL (ref 0.0–40.0)

## 2018-08-19 LAB — TSH: TSH: 0.71 u[IU]/mL (ref 0.35–4.50)

## 2018-08-19 LAB — CBC
HCT: 41.1 % (ref 36.0–46.0)
HEMOGLOBIN: 13.8 g/dL (ref 12.0–15.0)
MCHC: 33.5 g/dL (ref 30.0–36.0)
MCV: 91.1 fl (ref 78.0–100.0)
PLATELETS: 308 10*3/uL (ref 150.0–400.0)
RBC: 4.51 Mil/uL (ref 3.87–5.11)
RDW: 13.1 % (ref 11.5–15.5)
WBC: 9.3 10*3/uL (ref 4.0–10.5)

## 2018-08-19 NOTE — Assessment & Plan Note (Signed)
Continue levothyroxine 75 mcg daily.  Check TSH.  Check CBC and CMP.

## 2018-08-19 NOTE — Progress Notes (Signed)
Subjective:  Eudelia Hiltunen Gabay is a 73 y.o. female who presents today for her annual comprehensive physical exam.    HPI:  She has no acute complaints today.   Her stable, chronic medical conditions are outlined below:  # Hypothyroidism - On levothyroxine 47mcg daily. - ROS: No hot or cold intolerance  # Tachycardia - Currently on metoprolol 25mg  and tolerating well - ROS: No dizziness, chest pain, or shortness of breath.   # Migraine Disorder - Uses maxalt 5mg  as needed  Lifestyle Diet: No specific diets.  Exercise: Dances on the weekends. No other specific exercises.   Depression screen PHQ 2/9 08/19/2018  Decreased Interest 0  Down, Depressed, Hopeless 0  PHQ - 2 Score 0    There are no preventive care reminders to display for this patient.   ROS: Per HPI, otherwise a complete review of systems was negative.   PMH:  The following were reviewed and entered/updated in epic: Past Medical History:  Diagnosis Date  . Thyroid disease    Goiter   Patient Active Problem List   Diagnosis Date Noted  . Migraine headache with aura 03/30/2014  . Tachycardia 12/12/2011  . Hypothyroidism 04/14/2007  . OSTEOPOROSIS 04/14/2007   Past Surgical History:  Procedure Laterality Date  . BREAST SURGERY     right lump  . thyroid    . TUBAL LIGATION      Family History  Problem Relation Age of Onset  . Colon polyps Father 55  . Arthritis Other   . Cancer Other        colon, prostate  . Heart disease Mother        CHF, died age 13  . Lung cancer Brother   . Stomach cancer Neg Hx     Medications- reviewed and updated Current Outpatient Medications  Medication Sig Dispense Refill  . levothyroxine (SYNTHROID) 75 MCG tablet Take 1 tablet (75 mcg total) by mouth daily before breakfast. 100 tablet 4  . metoprolol succinate (TOPROL-XL) 25 MG 24 hr tablet Take 1 tablet (25 mg total) by mouth daily. 100 tablet 4  . rizatriptan (MAXALT-MLT) 5 MG disintegrating tablet Take  1 tablet (5 mg total) by mouth as needed for migraine. May repeat in 2 hours if needed 4 tablet 6  . traMADol (ULTRAM) 50 MG tablet TAKE 1/2-1 TABLET BY MOUTH EVERY 6 TO 8 HOURS AS NEEDED FOR PAIN 40 tablet 5   No current facility-administered medications for this visit.     Allergies-reviewed and updated No Known Allergies  Social History   Socioeconomic History  . Marital status: Legally Separated    Spouse name: Not on file  . Number of children: 2  . Years of education: Not on file  . Highest education level: Not on file  Occupational History  . Not on file  Social Needs  . Financial resource strain: Not on file  . Food insecurity:    Worry: Not on file    Inability: Not on file  . Transportation needs:    Medical: Not on file    Non-medical: Not on file  Tobacco Use  . Smoking status: Never Smoker  . Smokeless tobacco: Never Used  Substance and Sexual Activity  . Alcohol use: No  . Drug use: No  . Sexual activity: Not on file  Lifestyle  . Physical activity:    Days per week: Not on file    Minutes per session: Not on file  . Stress: Not on file  Relationships  . Social connections:    Talks on phone: Not on file    Gets together: Not on file    Attends religious service: Not on file    Active member of club or organization: Not on file    Attends meetings of clubs or organizations: Not on file    Relationship status: Not on file  Other Topics Concern  . Not on file  Social History Narrative   Lives with husband, son and grandson.    Objective:  Physical Exam: BP 118/74 (BP Location: Left Arm, Patient Position: Sitting, Cuff Size: Normal)   Pulse 66   Temp 98 F (36.7 C) (Oral)   Ht 5\' 4"  (1.626 m)   Wt 134 lb 6.4 oz (61 kg)   SpO2 98%   BMI 23.07 kg/m   Body mass index is 23.07 kg/m. Wt Readings from Last 3 Encounters:  08/19/18 134 lb 6.4 oz (61 kg)  10/21/17 130 lb (59 kg)  08/18/17 134 lb (60.8 kg)   Gen: NAD, resting comfortably HEENT:  TMs normal bilaterally. OP clear. No thyromegaly noted.  CV: RRR with no murmurs appreciated Pulm: NWOB, CTAB with no crackles, wheezes, or rhonchi GI: Normal bowel sounds present. Soft, Nontender, Nondistended. MSK: no edema, cyanosis, or clubbing noted Skin: warm, dry Neuro: CN2-12 grossly intact. Strength 5/5 in upper and lower extremities. Reflexes symmetric and intact bilaterally.  Psych: Normal affect and thought content  Assessment/Plan:  Tachycardia Regular rate and rhythm today.  Continue metoprolol succinate 25 mg daily.  Migraine headache with aura Stable.  Continue Imitrex as needed.  We will continue metoprolol as noted above.  Hypothyroidism Continue levothyroxine 75 mcg daily.  Check TSH.  Check CBC and CMP.  Preventative Healthcare: UTD on vaccines. Check lipid panel today.   Patient Counseling(The following topics were reviewed and/or handout was given):  -Nutrition: Stressed importance of moderation in sodium/caffeine intake, saturated fat and cholesterol, caloric balance, sufficient intake of fresh fruits, vegetables, and fiber.  -Stressed the importance of regular exercise.   -Substance Abuse: Discussed cessation/primary prevention of tobacco, alcohol, or other drug use; driving or other dangerous activities under the influence; availability of treatment for abuse.   -Injury prevention: Discussed safety belts, safety helmets, smoke detector, smoking near bedding or upholstery.   -Sexuality: Discussed sexually transmitted diseases, partner selection, use of condoms, avoidance of unintended pregnancy and contraceptive alternatives.   -Dental health: Discussed importance of regular tooth brushing, flossing, and dental visits.  -Health maintenance and immunizations reviewed. Please refer to Health maintenance section.  Return to care in 1 year for next preventative visit.   Algis Greenhouse. Jerline Pain, MD 08/19/2018 10:51 AM

## 2018-08-19 NOTE — Patient Instructions (Signed)
It was very nice to see you today!  No changes today.  Keep up the good work!  We will check blood work today.  I would like to see back in 1 year for your next physical, or sooner as needed.  Take care, Dr Jerline Pain

## 2018-08-19 NOTE — Assessment & Plan Note (Signed)
Regular rate and rhythm today.  Continue metoprolol succinate 25 mg daily.

## 2018-08-19 NOTE — Assessment & Plan Note (Signed)
Stable.  Continue Imitrex as needed.  We will continue metoprolol as noted above.

## 2018-08-23 ENCOUNTER — Encounter: Payer: Self-pay | Admitting: Family Medicine

## 2018-08-23 DIAGNOSIS — E785 Hyperlipidemia, unspecified: Secondary | ICD-10-CM | POA: Insufficient documentation

## 2018-08-23 NOTE — Progress Notes (Signed)
Please inform patient of the following:  Her cholesterol is elevated. Recommend starting lipitor 40mg  daily to improve numbers and lower risk of heart attack and stroke.  We should recheck in 6-12 months.   Everything else is normal. Would not recommend any other changes at this time.  Natalie [name]. Jerline Pain, MD 08/23/2018 1:37 PM

## 2018-08-24 ENCOUNTER — Encounter: Payer: Medicare HMO | Admitting: Family Medicine

## 2018-08-31 DIAGNOSIS — Z1231 Encounter for screening mammogram for malignant neoplasm of breast: Secondary | ICD-10-CM | POA: Diagnosis not present

## 2018-08-31 DIAGNOSIS — Z01419 Encounter for gynecological examination (general) (routine) without abnormal findings: Secondary | ICD-10-CM | POA: Diagnosis not present

## 2018-09-03 ENCOUNTER — Other Ambulatory Visit: Payer: Self-pay | Admitting: Obstetrics and Gynecology

## 2018-09-03 DIAGNOSIS — E2839 Other primary ovarian failure: Secondary | ICD-10-CM

## 2018-09-13 ENCOUNTER — Ambulatory Visit
Admission: RE | Admit: 2018-09-13 | Discharge: 2018-09-13 | Disposition: A | Discharge status: Home / Self Care | Payer: Medicare HMO | Source: Ambulatory Visit | Attending: Obstetrics and Gynecology | Admitting: Obstetrics and Gynecology

## 2018-09-13 DIAGNOSIS — M85851 Other specified disorders of bone density and structure, right thigh: Secondary | ICD-10-CM | POA: Diagnosis not present

## 2018-09-13 DIAGNOSIS — Z780 Asymptomatic menopausal state: Secondary | ICD-10-CM | POA: Diagnosis not present

## 2018-09-13 DIAGNOSIS — E2839 Other primary ovarian failure: Secondary | ICD-10-CM

## 2018-09-15 ENCOUNTER — Encounter: Payer: Self-pay | Admitting: Family Medicine

## 2018-09-15 ENCOUNTER — Ambulatory Visit (INDEPENDENT_AMBULATORY_CARE_PROVIDER_SITE_OTHER): Payer: Medicare HMO | Admitting: Family Medicine

## 2018-09-15 VITALS — BP 116/70 | HR 67 | Temp 97.8°F | Ht 64.0 in | Wt 138.6 lb

## 2018-09-15 DIAGNOSIS — L089 Local infection of the skin and subcutaneous tissue, unspecified: Secondary | ICD-10-CM

## 2018-09-15 DIAGNOSIS — H9201 Otalgia, right ear: Secondary | ICD-10-CM

## 2018-09-15 MED ORDER — CIPROFLOXACIN HCL 0.2 % OT SOLN: 0.2000 mL | Freq: Two times a day (BID) | OTIC | 0 refills | Status: DC

## 2018-09-15 MED ORDER — DOXYCYCLINE HYCLATE 100 MG PO TABS: 100.0000 mg | ORAL_TABLET | Freq: Two times a day (BID) | ORAL | 0 refills | Status: DC

## 2018-09-15 NOTE — Patient Instructions (Signed)
It was very nice to see you today!  Your have a skin infection in your right ear canal.  Please start the drops and the doxycycline.  Please let us know if your symptoms worsen or not improve the next 5 to 7 days.  Take care, Dr Jerline Pain

## 2018-09-15 NOTE — Progress Notes (Signed)
   Chief Complaint:  Natalie [name] is a 73 y.o. female who presents for same day appointment with a chief complaint of ear pain.   Assessment/Plan:  Skin infection/ear pain Patient with which appears to be superficial skin infection in her right EAC.  She has no red flags or signs of systemic illness.  Given the location of her infection, will treat both with topical ciprofloxacin drops as well as a course of oral doxycycline.  Encouraged good oral hydration.  Discussed reasons to return to care and seek emergent care.  Follow-up as needed.     Subjective:  HPI:  Ear Pain, acute problem Started about a week ago. Improving over the past day.  She was having itching in her right ear canal about a week ago and scratched it with her finger.  Since then has had pain to the area.  No fevers or chills.  No treatments tried.  Pain is described as a sharp pain that radiates into her temple.  No hearing loss.  No recent illnesses.  No other obvious alleviating or aggravating factors.    ROS: Per HPI  PMH: She reports that she has never smoked. She has never used smokeless tobacco. She reports that she does not drink alcohol or use drugs.      Objective:  Physical Exam: BP 116/70 (BP Location: Left Arm, Patient Position: Sitting, Cuff Size: Normal)   Pulse 67   Temp 97.8 F (36.6 C) (Oral)   Ht 5\' 4"  (1.626 m)   Wt 138 lb 9.6 oz (62.9 kg)   SpO2 97%   BMI 23.79 kg/m   Gen: NAD, resting comfortably HEENT: Right EAC with erythematous, fluctuant mass with purulent drainage on anterior aspect.  Left EAC clear.  Bilateral TMs clear.      Algis Greenhouse. Jerline Pain, MD 09/15/2018 10:22 AM

## 2018-09-22 DIAGNOSIS — M8580 Other specified disorders of bone density and structure, unspecified site: Secondary | ICD-10-CM | POA: Diagnosis not present

## 2018-10-11 ENCOUNTER — Other Ambulatory Visit: Payer: Self-pay

## 2018-10-11 ENCOUNTER — Telehealth: Payer: Self-pay | Admitting: Family Medicine

## 2018-10-11 MED ORDER — METOPROLOL SUCCINATE ER 25 MG PO TB24: 25.0000 mg | ORAL_TABLET | Freq: Every day | ORAL | 1 refills | Status: DC

## 2018-10-11 MED ORDER — LEVOTHYROXINE SODIUM 75 MCG PO TABS: 75.0000 ug | ORAL_TABLET | Freq: Every day | ORAL | 1 refills | Status: DC

## 2018-10-11 NOTE — Telephone Encounter (Signed)
Encounter signed in error   °

## 2018-10-11 NOTE — Telephone Encounter (Signed)
Copied from [place] [id number]. Topic: Quick Communication - Rx Refill/Question >> Oct 11, 2018 12:30 PM Burchel, Abbi R wrote: Medication: levothyroxine (SYNTHROID) 75 MCG tablet, metoprolol succinate (TOPROL-XL) 25 MG 24 hr tablet  Preferred Pharmacy: [facility], [address] [place] [address] Phone: [phone] Fax: [phone]    Pt was advised that RX refills may take up to 3 business days. We ask that you follow-up with your pharmacy.

## 2018-10-11 NOTE — Telephone Encounter (Signed)
Rx sent to pharmacy   

## 2019-02-23 ENCOUNTER — Telehealth: Payer: Self-pay | Admitting: Family Medicine

## 2019-02-23 NOTE — Telephone Encounter (Signed)
I left a message asking the patient to call me at [phone] to schedule AWV with Courtney. VDM (Dee-Dee) °

## 2019-02-23 NOTE — Telephone Encounter (Signed)
The patient called me back and scheduled her AWV with Loma Sousa.  She also mentioned that her cholesterol was high when she last saw Dr. Jerline Pain, and she would like to get it checked again to see how it's going.  I told her that I would send the message. VDM (Dee-Dee)

## 2019-02-24 ENCOUNTER — Other Ambulatory Visit: Payer: Self-pay

## 2019-02-24 DIAGNOSIS — E785 Hyperlipidemia, unspecified: Secondary | ICD-10-CM

## 2019-02-24 NOTE — Telephone Encounter (Signed)
Ok with me. Please place any necessary orders. 

## 2019-02-24 NOTE — Telephone Encounter (Signed)
Patient has AWV appointment wants to have Lipids checked.Please advise

## 2019-02-24 NOTE — Telephone Encounter (Signed)
Left voice message for patient to call clinic.  

## 2019-02-25 ENCOUNTER — Ambulatory Visit: Payer: Medicare HMO

## 2019-02-28 ENCOUNTER — Ambulatory Visit (INDEPENDENT_AMBULATORY_CARE_PROVIDER_SITE_OTHER): Payer: Medicare HMO

## 2019-02-28 ENCOUNTER — Other Ambulatory Visit (INDEPENDENT_AMBULATORY_CARE_PROVIDER_SITE_OTHER): Payer: Medicare HMO

## 2019-02-28 ENCOUNTER — Other Ambulatory Visit: Payer: Self-pay

## 2019-02-28 VITALS — BP 128/72 | Temp 97.0°F | Ht 64.0 in | Wt 134.8 lb

## 2019-02-28 DIAGNOSIS — E785 Hyperlipidemia, unspecified: Secondary | ICD-10-CM

## 2019-02-28 DIAGNOSIS — Z0000 Encounter for general adult medical examination without abnormal findings: Secondary | ICD-10-CM

## 2019-02-28 LAB — LIPID PANEL
Cholesterol: 215 mg/dL — ABNORMAL HIGH (ref 0–200)
HDL: 49.4 mg/dL (ref >39.00)
LDL Cholesterol: 129 mg/dL — ABNORMAL HIGH (ref 0–99)
NonHDL: 165.88
Total CHOL/HDL Ratio: 4
Triglycerides: 185 mg/dL — ABNORMAL HIGH (ref 0.0–149.0)
VLDL: 37 mg/dL (ref 0.0–40.0)

## 2019-02-28 NOTE — Telephone Encounter (Signed)
Noted that patient scheduled for 8/17 for AWV @ 1145.  Can have lipids checked while in office.  Orders placed

## 2019-02-28 NOTE — Progress Notes (Signed)
I have personally reviewed the Medicare Annual Wellness Visit and agree with the assessment and plan.  Algis Greenhouse. Jerline Pain, MD 02/28/2019 2:48 PM

## 2019-02-28 NOTE — Patient Instructions (Addendum)
Natalie [name] , Thank you for taking time to come for your Medicare Wellness Visit. I appreciate your ongoing commitment to your health goals. Please review the following plan we discussed and let me know if I can assist you in the future.   Screening recommendations/referrals: Colorectal Screening: up to date; last 12/10/10 Mammogram: up to date; last 08/31/18 Bone Density: up to date; last 09/13/18  Vision and Dental Exams: Recommended annual ophthalmology exams for early detection of glaucoma and other disorders of the eye Recommended annual dental exams for proper oral hygiene  Vaccinations: Influenza vaccine:  recommended this fall either at PCP office or through your local pharmacy  Pneumococcal vaccine: up to date; last 04/24/15 Tdap vaccine: last 04/17/09 Shingles vaccine: Please call your insurance company to determine your out of pocket expense for the Shingrix vaccine. You may receive this vaccine at your local pharmacy.  Advanced directives: Advance directives discussed with you today. I have provided a copy for you to complete at home and have notarized. Once this is complete please bring a copy in to our office so we can scan it into your chart.  Goals: Recommend to exercise for at least 150 minutes per week.  Next appointment: Please schedule your Annual Wellness Visit with your Nurse Health Advisor in one year.  Preventive Care 71 Years and Older, Female Preventive care refers to lifestyle choices and visits with your health care provider that can promote health and wellness. What does preventive care include?  A yearly physical exam. This is also called an annual well check.  Dental exams once or twice a year.  Routine eye exams. Ask your health care provider how often you should have your eyes checked.  Personal lifestyle choices, including:  Daily care of your teeth and gums.  Regular physical activity.  Eating a healthy diet.  Avoiding tobacco and drug use.   Limiting alcohol use.  Practicing safe sex.  Taking low-dose aspirin every day if recommended by your health care provider.  Taking vitamin and mineral supplements as recommended by your health care provider. What happens during an annual well check? The services and screenings done by your health care provider during your annual well check will depend on your age, overall health, lifestyle risk factors, and family history of disease. Counseling  Your health care provider may ask you questions about your:  Alcohol use.  Tobacco use.  Drug use.  Emotional well-being.  Home and relationship well-being.  Sexual activity.  Eating habits.  History of falls.  Memory and ability to understand (cognition).  Work and work Statistician.  Reproductive health. Screening  You may have the following tests or measurements:  Height, weight, and BMI.  Blood pressure.  Lipid and cholesterol levels. These may be checked every 5 years, or more frequently if you are over 9 years old.  Skin check.  Lung cancer screening. You may have this screening every year starting at age 9 if you have a 30-pack-year history of smoking and currently smoke or have quit within the past 15 years.  Fecal occult blood test (FOBT) of the stool. You may have this test every year starting at age 28.  Flexible sigmoidoscopy or colonoscopy. You may have a sigmoidoscopy every 5 years or a colonoscopy every 10 years starting at age 35.  Hepatitis C blood test.  Hepatitis B blood test.  Sexually transmitted disease (STD) testing.  Diabetes screening. This is done by checking your blood sugar (glucose) after you have not eaten  for a while (fasting). You may have this done every 1-3 years.  Bone density scan. This is done to screen for osteoporosis. You may have this done starting at age 22.  Mammogram. This may be done every 1-2 years. Talk to your health care provider about how often you should have  regular mammograms. Talk with your health care provider about your test results, treatment options, and if necessary, the need for more tests. Vaccines  Your health care provider may recommend certain vaccines, such as:  Influenza vaccine. This is recommended every year.  Tetanus, diphtheria, and acellular pertussis (Tdap, Td) vaccine. You may need a Td booster every 10 years.  Zoster vaccine. You may need this after age 59.  Pneumococcal 13-valent conjugate (PCV13) vaccine. One dose is recommended after age 57.  Pneumococcal polysaccharide (PPSV23) vaccine. One dose is recommended after age 46. Talk to your health care provider about which screenings and vaccines you need and how often you need them. This information is not intended to replace advice given to you by your health care provider. Make sure you discuss any questions you have with your health care provider. Document Released: 07/27/2015 Document Revised: 03/19/2016 Document Reviewed: 05/01/2015 Elsevier Interactive Patient Education  2017 [place] Prevention in the Home Falls can cause injuries. They can happen to people of all ages. There are many things you can do to make your home safe and to help prevent falls. What can I do on the outside of my home?  Regularly fix the edges of walkways and driveways and fix any cracks.  Remove anything that might make you trip as you walk through a door, such as a raised step or threshold.  Trim any bushes or trees on the path to your home.  Use bright outdoor lighting.  Clear any walking paths of anything that might make someone trip, such as rocks or tools.  Regularly check to see if handrails are loose or broken. Make sure that both sides of any steps have handrails.  Any raised decks and porches should have guardrails on the edges.  Have any leaves, snow, or ice cleared regularly.  Use sand or salt on walking paths during winter.  Clean up any spills in your  garage right away. This includes oil or grease spills. What can I do in the bathroom?  Use night lights.  Install grab bars by the toilet and in the tub and shower. Do not use towel bars as grab bars.  Use non-skid mats or decals in the tub or shower.  If you need to sit down in the shower, use a plastic, non-slip stool.  Keep the floor dry. Clean up any water that spills on the floor as soon as it happens.  Remove soap buildup in the tub or shower regularly.  Attach bath mats securely with double-sided non-slip rug tape.  Do not have throw rugs and other things on the floor that can make you trip. What can I do in the bedroom?  Use night lights.  Make sure that you have a light by your bed that is easy to reach.  Do not use any sheets or blankets that are too big for your bed. They should not hang down onto the floor.  Have a firm chair that has side arms. You can use this for support while you get dressed.  Do not have throw rugs and other things on the floor that can make you trip. What can I do  in the kitchen?  Clean up any spills right away.  Avoid walking on wet floors.  Keep items that you use a lot in easy-to-reach places.  If you need to reach something above you, use a strong step stool that has a grab bar.  Keep electrical cords out of the way.  Do not use floor polish or wax that makes floors slippery. If you must use wax, use non-skid floor wax.  Do not have throw rugs and other things on the floor that can make you trip. What can I do with my stairs?  Do not leave any items on the stairs.  Make sure that there are handrails on both sides of the stairs and use them. Fix handrails that are broken or loose. Make sure that handrails are as long as the stairways.  Check any carpeting to make sure that it is firmly attached to the stairs. Fix any carpet that is loose or worn.  Avoid having throw rugs at the top or bottom of the stairs. If you do have throw  rugs, attach them to the floor with carpet tape.  Make sure that you have a light switch at the top of the stairs and the bottom of the stairs. If you do not have them, ask someone to add them for you. What else can I do to help prevent falls?  Wear shoes that:  Do not have high heels.  Have rubber bottoms.  Are comfortable and fit you well.  Are closed at the toe. Do not wear sandals.  If you use a stepladder:  Make sure that it is fully opened. Do not climb a closed stepladder.  Make sure that both sides of the stepladder are locked into place.  Ask someone to hold it for you, if possible.  Clearly mark and make sure that you can see:  Any grab bars or handrails.  First and last steps.  Where the edge of each step is.  Use tools that help you move around (mobility aids) if they are needed. These include:  Canes.  Walkers.  Scooters.  Crutches.  Turn on the lights when you go into a dark area. Replace any light bulbs as soon as they burn out.  Set up your furniture so you have a clear path. Avoid moving your furniture around.  If any of your floors are uneven, fix them.  If there are any pets around you, be aware of where they are.  Review your medicines with your doctor. Some medicines can make you feel dizzy. This can increase your chance of falling. Ask your doctor what other things that you can do to help prevent falls. This information is not intended to replace advice given to you by your health care provider. Make sure you discuss any questions you have with your health care provider. Document Released: 04/26/2009 Document Revised: 12/06/2015 Document Reviewed: 08/04/2014 Elsevier Interactive Patient Education  2017 [employer].

## 2019-02-28 NOTE — Progress Notes (Signed)
Subjective:   Ariana Juul Tappen is a 73 y.o. female who presents for Medicare Annual (Subsequent) preventive examination.  Review of Systems:   Cardiac Risk Factors include: dyslipidemia;advanced age (>68men, >93 women)     Objective:     Vitals: BP 128/72   Temp (!) 97 F (36.1 C)   Ht 5\' 4"  (1.626 m)   Wt 134 lb 12.8 oz (61.1 kg)   BMI 23.14 kg/m   Body mass index is 23.14 kg/m.  Advanced Directives 02/28/2019 11/05/2016  Does Patient Have a Medical Advance Directive? No No  Would patient like information on creating a medical advance directive? Yes (MAU/Ambulatory/Procedural Areas - Information given) -    Tobacco Social History   Tobacco Use  Smoking Status Never Smoker  Smokeless Tobacco Never Used     Counseling given: Not Answered   Clinical Intake:  Pre-visit preparation completed: Yes  Pain : No/denies pain  Diabetes: No  How often do you need to have someone help you when you read instructions, pamphlets, or other written materials from your doctor or pharmacy?: 1 - Never  Interpreter Needed?: No  Information entered by :: Denman George LPN  Past Medical History:  Diagnosis Date  . Thyroid disease    Goiter   Past Surgical History:  Procedure Laterality Date  . BREAST SURGERY     right lump  . thyroid    . TUBAL LIGATION     Family History  Problem Relation Age of Onset  . Colon polyps Father 47  . Arthritis Other   . Cancer Other        colon, prostate  . Heart disease Mother        CHF, died age 98  . Lung cancer Brother   . Stomach cancer Neg Hx    Social History   Socioeconomic History  . Marital status: Legally Separated    Spouse name: Not on file  . Number of children: 2  . Years of education: Not on file  . Highest education level: Not on file  Occupational History  . Not on file  Social Needs  . Financial resource strain: Not on file  . Food insecurity    Worry: Not on file    Inability: Not on file  .  Transportation needs    Medical: Not on file    Non-medical: Not on file  Tobacco Use  . Smoking status: Never Smoker  . Smokeless tobacco: Never Used  Substance and Sexual Activity  . Alcohol use: No  . Drug use: No  . Sexual activity: Not on file  Lifestyle  . Physical activity    Days per week: Not on file    Minutes per session: Not on file  . Stress: Not on file  Relationships  . Social Herbalist on phone: Not on file    Gets together: Not on file    Attends religious service: Not on file    Active member of club or organization: Not on file    Attends meetings of clubs or organizations: Not on file    Relationship status: Not on file  Other Topics Concern  . Not on file  Social History Narrative   Lives with husband, son and grandson.    Outpatient Encounter Medications as of 02/28/2019  Medication Sig  . Ciprofloxacin HCl 0.2 % otic solution Place 0.2 mLs into the right ear 2 (two) times daily.  Marland Kitchen doxycycline (VIBRA-TABS) 100 MG  tablet Take 1 tablet (100 mg total) by mouth 2 (two) times daily.  Marland Kitchen levothyroxine (SYNTHROID) 75 MCG tablet Take 1 tablet (75 mcg total) by mouth daily before breakfast.  . metoprolol succinate (TOPROL-XL) 25 MG 24 hr tablet Take 1 tablet (25 mg total) by mouth daily.  . rizatriptan (MAXALT-MLT) 5 MG disintegrating tablet Take 1 tablet (5 mg total) by mouth as needed for migraine. May repeat in 2 hours if needed  . traMADol (ULTRAM) 50 MG tablet TAKE 1/2-1 TABLET BY MOUTH EVERY 6 TO 8 HOURS AS NEEDED FOR PAIN   No facility-administered encounter medications on file as of 02/28/2019.     Activities of Daily Living In your present state of health, do you have any difficulty performing the following activities: 02/28/2019  Hearing? N  Vision? N  Difficulty concentrating or making decisions? N  Walking or climbing stairs? N  Dressing or bathing? N  Doing errands, shopping? N  Preparing Food and eating ? N  In the past six months,  have you accidently leaked urine? N  Do you have problems with loss of bowel control? N  Managing your Medications? N  Managing your Finances? N  Housekeeping or managing your Housekeeping? N  Some recent data might be hidden    Patient Care Team: Vivi Barrack, MD as PCP - General (Family Medicine) Allyn Kenner, DO as Consulting Physician (Obstetrics and Gynecology)    Assessment:   This is a routine wellness examination for Demani.  Exercise Activities and Dietary recommendations Current Exercise Habits: Home exercise routine, Type of exercise: walking, Time (Minutes): 30, Frequency (Times/Week): 5, Weekly Exercise (Minutes/Week): 150, Intensity: Mild  Goals    . Eat more fruits and vegetables     Check out  online nutrition programs as GumSearch.nl and http://vang.com/; fit69me; Look for foods with "whole" wheat; bran; oatmeal etc Shot at the farmer's markets in season for fresher choices  Watch for "hydrogenated" on the label of oils which are trans-fats.  Watch for "high fructose corn syrup" in snacks, yogurt or ketchup  Meats have less marbling; bright colored fruits and vegetables;  Canned; dump out liquid and wash vegetables. Be mindful of what we are eating  Portion control is essential to a health weight! Sit down; take a break and enjoy your meal; take smaller bites; put the fork down between bites;  It takes 20 minutes to get full; so check in with your fullness cues and stop eating when you start to fill full              Fall Risk Fall Risk  02/28/2019 11/05/2016 08/11/2016 08/06/2015 03/30/2014  Falls in the past year? 0 No No No No  Number falls in past yr: 0 - - - -  Injury with Fall? 0 - - - -  Follow up Education provided - - - -   Timed Get Up and Go performed: completed and within normal timeframe   Depression Screen PHQ 2/9 Scores 02/28/2019 08/19/2018 11/05/2016 08/11/2016  PHQ - 2 Score 0 0 0 0     Cognitive Function MMSE - Mini Mental  State Exam 11/05/2016  Not completed: (No Data)   Patient with no memory concerns.  No observation of concern at this time.   Immunization History  Administered Date(s) Administered  . Influenza Inj Mdck Quad With Preservative 04/13/2018  . Influenza Split 04/24/2011, 04/27/2012  . Influenza Whole 04/17/2009  . Influenza, High Dose Seasonal PF 04/28/2013, 04/24/2015, 04/17/2016, 04/22/2017  .  Influenza,inj,Quad PF,6+ Mos 05/01/2014, 04/07/2018  . Pneumococcal Conjugate-13 05/01/2014  . Pneumococcal Polysaccharide-23 04/28/2013, 04/24/2015  . Td 07/14/1997, 04/17/2009    Qualifies for Shingles Vaccine? Discussed and patient will check with pharmacy for coverage.  Patient education handout provided    Screening Tests Health Maintenance  Topic Date Due  . INFLUENZA VACCINE  02/12/2019  . TETANUS/TDAP  04/18/2019  . MAMMOGRAM  08/31/2020  . COLONOSCOPY  12/09/2021  . DEXA SCAN  Completed  . Hepatitis C Screening  Completed  . PNA vac Low Risk Adult  Completed    Cancer Screenings: Breast:  Up to date on Mammogram? Yes; last 08/31/18   Up to date of Bone Density/Dexa? Yes; last 09/13/18 Colorectal: Colonoscopy 12/10/11 Dr. Verl Blalock   Additional Screenings:  Hepatitis C Screening: 08/18/17     Plan:    I have personally reviewed and addressed the Medicare Annual Wellness questionnaire and have noted the following in the patient's chart:  A. Medical and social history B. Use of alcohol, tobacco or illicit drugs  C. Current medications and supplements D. Functional ability and status E.  Nutritional status F.  Physical activity G. Advance directives H. List of other physicians I.  Hospitalizations, surgeries, and ER visits in previous 12 months J.  [place] such as hearing and vision if needed, cognitive and depression L. Referrals, records requested, and appointments- none   In addition, I have reviewed and discussed with patient certain preventive  protocols, quality metrics, and best practice recommendations. A written personalized care plan for preventive services as well as general preventive health recommendations were provided to patient.   Signed,  Denman George, LPN  Nurse Health Advisor   Nurse Notes: none

## 2019-03-01 NOTE — Progress Notes (Signed)
Please inform patient of the following:  Her cholesterol levels are better than 6 months ago. Would like for her to keep up the good work and we can recheck in 6-12 months.  Natalie [name]. Jerline Pain, MD 03/01/2019 7:55 AM

## 2019-03-03 DIAGNOSIS — H40023 Open angle with borderline findings, high risk, bilateral: Secondary | ICD-10-CM | POA: Diagnosis not present

## 2019-03-03 DIAGNOSIS — H43812 Vitreous degeneration, left eye: Secondary | ICD-10-CM | POA: Diagnosis not present

## 2019-03-03 DIAGNOSIS — H04123 Dry eye syndrome of bilateral lacrimal glands: Secondary | ICD-10-CM | POA: Diagnosis not present

## 2019-03-03 DIAGNOSIS — H2513 Age-related nuclear cataract, bilateral: Secondary | ICD-10-CM | POA: Diagnosis not present

## 2019-04-12 ENCOUNTER — Other Ambulatory Visit: Payer: Self-pay

## 2019-04-12 ENCOUNTER — Ambulatory Visit (INDEPENDENT_AMBULATORY_CARE_PROVIDER_SITE_OTHER): Payer: Medicare HMO

## 2019-04-12 DIAGNOSIS — Z23 Encounter for immunization: Secondary | ICD-10-CM

## 2019-04-13 DIAGNOSIS — L821 Other seborrheic keratosis: Secondary | ICD-10-CM | POA: Diagnosis not present

## 2019-04-13 DIAGNOSIS — L2084 Intrinsic (allergic) eczema: Secondary | ICD-10-CM | POA: Diagnosis not present

## 2019-04-21 ENCOUNTER — Other Ambulatory Visit: Payer: Self-pay | Admitting: Family Medicine

## 2019-04-21 DIAGNOSIS — H43813 Vitreous degeneration, bilateral: Secondary | ICD-10-CM | POA: Diagnosis not present

## 2019-04-21 DIAGNOSIS — H2513 Age-related nuclear cataract, bilateral: Secondary | ICD-10-CM | POA: Diagnosis not present

## 2019-04-21 DIAGNOSIS — H40023 Open angle with borderline findings, high risk, bilateral: Secondary | ICD-10-CM | POA: Diagnosis not present

## 2019-04-21 DIAGNOSIS — H04123 Dry eye syndrome of bilateral lacrimal glands: Secondary | ICD-10-CM | POA: Diagnosis not present

## 2019-05-16 DIAGNOSIS — H2512 Age-related nuclear cataract, left eye: Secondary | ICD-10-CM | POA: Diagnosis not present

## 2019-05-23 DIAGNOSIS — H2512 Age-related nuclear cataract, left eye: Secondary | ICD-10-CM | POA: Diagnosis not present

## 2019-05-23 DIAGNOSIS — H25812 Combined forms of age-related cataract, left eye: Secondary | ICD-10-CM | POA: Diagnosis not present

## 2019-06-02 DIAGNOSIS — H2511 Age-related nuclear cataract, right eye: Secondary | ICD-10-CM | POA: Diagnosis not present

## 2019-06-13 DIAGNOSIS — H2511 Age-related nuclear cataract, right eye: Secondary | ICD-10-CM | POA: Diagnosis not present

## 2019-06-13 DIAGNOSIS — H25811 Combined forms of age-related cataract, right eye: Secondary | ICD-10-CM | POA: Diagnosis not present

## 2019-07-01 ENCOUNTER — Telehealth: Payer: Self-pay | Admitting: Family Medicine

## 2019-07-01 ENCOUNTER — Other Ambulatory Visit: Payer: Self-pay | Admitting: Family Medicine

## 2019-07-01 NOTE — Telephone Encounter (Signed)
See note  Copied from [place] [id number]. Topic: General - Call Back - No Documentation >> Jul 01, 2019  3:30 PM Erick Blinks wrote: Reason for CRM: Pt has questions about medication that has been sent in today please advise Best contact: [phone]

## 2019-07-04 NOTE — Telephone Encounter (Signed)
It should be safe, but recommend we check thyroid level in 4-6 weeks to make sure dose does not need to be adjusted.  Algis Greenhouse. Jerline Pain, MD 07/04/2019 12:35 PM

## 2019-07-04 NOTE — Telephone Encounter (Signed)
Patient states they stopped making her brand of levothyroxine (Synthroid) and have now given her Euthyrox.  In the past she has had difficulty when switching brands with her thyroid medication.  She would like to know if you have any knowledge of this Euthyrox and if it is safe for her to take?

## 2019-07-04 NOTE — Telephone Encounter (Signed)
Notified patient voices understanding she will call to schedule appt

## 2019-07-21 DIAGNOSIS — L249 Irritant contact dermatitis, unspecified cause: Secondary | ICD-10-CM | POA: Diagnosis not present

## 2019-07-21 DIAGNOSIS — L821 Other seborrheic keratosis: Secondary | ICD-10-CM | POA: Diagnosis not present

## 2019-08-01 ENCOUNTER — Other Ambulatory Visit: Payer: Self-pay

## 2019-08-01 ENCOUNTER — Ambulatory Visit (INDEPENDENT_AMBULATORY_CARE_PROVIDER_SITE_OTHER): Payer: Medicare HMO | Admitting: Family Medicine

## 2019-08-01 ENCOUNTER — Encounter: Payer: Self-pay | Admitting: Family Medicine

## 2019-08-01 VITALS — BP 118/80 | HR 83 | Temp 97.6°F | Ht 64.0 in | Wt 135.0 lb

## 2019-08-01 DIAGNOSIS — R000 Tachycardia, unspecified: Secondary | ICD-10-CM

## 2019-08-01 DIAGNOSIS — Z0001 Encounter for general adult medical examination with abnormal findings: Secondary | ICD-10-CM | POA: Diagnosis not present

## 2019-08-01 DIAGNOSIS — E785 Hyperlipidemia, unspecified: Secondary | ICD-10-CM

## 2019-08-01 DIAGNOSIS — Z13220 Encounter for screening for lipoid disorders: Secondary | ICD-10-CM | POA: Diagnosis not present

## 2019-08-01 DIAGNOSIS — E039 Hypothyroidism, unspecified: Secondary | ICD-10-CM

## 2019-08-01 LAB — COMPREHENSIVE METABOLIC PANEL
ALT: 22 U/L (ref 0–35)
AST: 20 U/L (ref 0–37)
Albumin: 4.3 g/dL (ref 3.5–5.2)
Alkaline Phosphatase: 95 U/L (ref 39–117)
BUN: 15 mg/dL (ref 6–23)
CO2: 27 mEq/L (ref 19–32)
Calcium: 9.7 mg/dL (ref 8.4–10.5)
Chloride: 101 mEq/L (ref 96–112)
Creatinine, Ser: 0.79 mg/dL (ref 0.40–1.20)
GFR: 71.56 mL/min (ref >60.00)
Glucose, Bld: 91 mg/dL (ref 70–99)
Potassium: 4.3 mEq/L (ref 3.5–5.1)
Sodium: 138 mEq/L (ref 135–145)
Total Bilirubin: 0.4 mg/dL (ref 0.2–1.2)
Total Protein: 8.1 g/dL (ref 6.0–8.3)

## 2019-08-01 LAB — CBC
HCT: 40.6 % (ref 36.0–46.0)
Hemoglobin: 13.5 g/dL (ref 12.0–15.0)
MCHC: 33.3 g/dL (ref 30.0–36.0)
MCV: 90.6 fl (ref 78.0–100.0)
Platelets: 279 10*3/uL (ref 150.0–400.0)
RBC: 4.48 Mil/uL (ref 3.87–5.11)
RDW: 13.2 % (ref 11.5–15.5)
WBC: 10.1 10*3/uL (ref 4.0–10.5)

## 2019-08-01 LAB — LIPID PANEL
Cholesterol: 222 mg/dL — ABNORMAL HIGH (ref 0–200)
HDL: 50.5 mg/dL (ref >39.00)
NonHDL: 171.48
Total CHOL/HDL Ratio: 4
Triglycerides: 238 mg/dL — ABNORMAL HIGH (ref 0.0–149.0)
VLDL: 47.6 mg/dL — ABNORMAL HIGH (ref 0.0–40.0)

## 2019-08-01 LAB — TSH: TSH: 0.78 u[IU]/mL (ref 0.35–4.50)

## 2019-08-01 LAB — LDL CHOLESTEROL, DIRECT: Direct LDL: 146 mg/dL

## 2019-08-01 NOTE — Assessment & Plan Note (Signed)
Check lipid panel  

## 2019-08-01 NOTE — Assessment & Plan Note (Signed)
Pulse 83 today.  Continue metoprolol succinate 25 mg daily.

## 2019-08-01 NOTE — Assessment & Plan Note (Signed)
Recently changed brands of levothyroxine.  Will check TSH today.  Will likely need to come back in 4 to 6 weeks to recheck given brain change.

## 2019-08-01 NOTE — Patient Instructions (Signed)
It was very nice to see you today!  We will check blood work today.  Please come back in 4 to 6 weeks to recheck your thyroid number.  Come back in 1 year for your next checkup, or sooner if needed.  Take care, Dr Jerline Pain  Please try these tips to maintain a healthy lifestyle:   Eat at least 3 REAL meals and 1-2 snacks per day.  Aim for no more than 5 hours between eating.  If you eat breakfast, please do so within one hour of getting up.    Each meal should contain half fruits/vegetables, one quarter protein, and one quarter carbs (no bigger than a computer mouse)   Cut down on sweet beverages. This includes juice, soda, and sweet tea.     Drink at least 1 glass of water with each meal and aim for at least 8 glasses per day   Exercise at least 150 minutes every week.    Preventive Care 19 Years and Older, Female Preventive care refers to lifestyle choices and visits with your health care provider that can promote health and wellness. This includes:  A yearly physical exam. This is also called an annual well check.  Regular dental and eye exams.  Immunizations.  Screening for certain conditions.  Healthy lifestyle choices, such as diet and exercise. What can I expect for my preventive care visit? Physical exam Your health care provider will check:  Height and weight. These may be used to calculate body mass index (BMI), which is a measurement that tells if you are at a healthy weight.  Heart rate and blood pressure.  Your skin for abnormal spots. Counseling Your health care provider may ask you questions about:  Alcohol, tobacco, and drug use.  Emotional well-being.  Home and relationship well-being.  Sexual activity.  Eating habits.  History of falls.  Memory and ability to understand (cognition).  Work and work Statistician.  Pregnancy and menstrual history. What immunizations do I need?  Influenza (flu) vaccine  This is recommended every  year. Tetanus, diphtheria, and pertussis (Tdap) vaccine  You may need a Td booster every 10 years. Varicella (chickenpox) vaccine  You may need this vaccine if you have not already been vaccinated. Zoster (shingles) vaccine  You may need this after age 74. Pneumococcal conjugate (PCV13) vaccine  One dose is recommended after age 42. Pneumococcal polysaccharide (PPSV23) vaccine  One dose is recommended after age 57. Measles, mumps, and rubella (MMR) vaccine  You may need at least one dose of MMR if you were born in 1957 or later. You may also need a second dose. Meningococcal conjugate (MenACWY) vaccine  You may need this if you have certain conditions. Hepatitis A vaccine  You may need this if you have certain conditions or if you travel or work in places where you may be exposed to hepatitis A. Hepatitis B vaccine  You may need this if you have certain conditions or if you travel or work in places where you may be exposed to hepatitis B. Haemophilus influenzae type b (Hib) vaccine  You may need this if you have certain conditions. You may receive vaccines as individual doses or as more than one vaccine together in one shot (combination vaccines). Talk with your health care provider about the risks and benefits of combination vaccines. What tests do I need? Blood tests  Lipid and cholesterol levels. These may be checked every 5 years, or more frequently depending on your overall health.  Hepatitis  C test.  Hepatitis B test. Screening  Lung cancer screening. You may have this screening every year starting at age 61 if you have a 30-pack-year history of smoking and currently smoke or have quit within the past 15 years.  Colorectal cancer screening. All adults should have this screening starting at age 33 and continuing until age 49. Your health care provider may recommend screening at age 48 if you are at increased risk. You will have tests every 1-10 years, depending on  your results and the type of screening test.  Diabetes screening. This is done by checking your blood sugar (glucose) after you have not eaten for a while (fasting). You may have this done every 1-3 years.  Mammogram. This may be done every 1-2 years. Talk with your health care provider about how often you should have regular mammograms.  BRCA-related cancer screening. This may be done if you have a family history of breast, ovarian, tubal, or peritoneal cancers. Other tests  Sexually transmitted disease (STD) testing.  Bone density scan. This is done to screen for osteoporosis. You may have this done starting at age 34. Follow these instructions at home: Eating and drinking  Eat a diet that includes fresh fruits and vegetables, whole grains, lean protein, and low-fat dairy products. Limit your intake of foods with high amounts of sugar, saturated fats, and salt.  Take vitamin and mineral supplements as recommended by your health care provider.  Do not drink alcohol if your health care provider tells you not to drink.  If you drink alcohol: ? Limit how much you have to 0-1 drink a day. ? Be aware of how much alcohol is in your drink. In the [place] one drink equals one 12 oz bottle of beer (355 mL), one 5 oz glass of wine (148 mL), or one 1 oz glass of hard liquor (44 mL). Lifestyle  Take daily care of your teeth and gums.  Stay active. Exercise for at least 30 minutes on 5 or more days each week.  Do not use any products that contain nicotine or tobacco, such as cigarettes, e-cigarettes, and chewing tobacco. If you need help quitting, ask your health care provider.  If you are sexually active, practice safe sex. Use a condom or other form of protection in order to prevent STIs (sexually transmitted infections).  Talk with your health care provider about taking a low-dose aspirin or statin. What's next?  Go to your health care provider once a year for a well check visit.  Ask  your health care provider how often you should have your eyes and teeth checked.  Stay up to date on all vaccines. This information is not intended to replace advice given to you by your health care provider. Make sure you discuss any questions you have with your health care provider. Document Revised: 06/24/2018 Document Reviewed: 06/24/2018 Elsevier Patient Education  2020 [employer].

## 2019-08-01 NOTE — Progress Notes (Signed)
Chief Complaint:  Natalie [name] is a 74 y.o. female who presents today for her annual comprehensive physical exam.    Assessment/Plan:  Chronic Problems Addressed Today: Hypothyroidism Recently changed brands of levothyroxine.  Will check TSH today.  Will likely need to come back in 4 to 6 weeks to recheck given brain change.  Tachycardia Pulse 83 today.  Continue metoprolol succinate 25 mg daily.  Dyslipidemia Check lipid panel.   Preventative Healthcare: Up-to-date on colon cancer screening.  Will get mammogram next month.  Discussed Covid vaccine.  Patient Counseling(The following topics were reviewed and/or handout was given):  -Nutrition: Stressed importance of moderation in sodium/caffeine intake, saturated fat and cholesterol, caloric balance, sufficient intake of fresh fruits, vegetables, and fiber.  -Stressed the importance of regular exercise.   -Substance Abuse: Discussed cessation/primary prevention of tobacco, alcohol, or other drug use; driving or other dangerous activities under the influence; availability of treatment for abuse.   -Injury prevention: Discussed safety belts, safety helmets, smoke detector, smoking near bedding or upholstery.   -Sexuality: Discussed sexually transmitted diseases, partner selection, use of condoms, avoidance of unintended pregnancy and contraceptive alternatives.   -Dental health: Discussed importance of regular tooth brushing, flossing, and dental visits.  -Health maintenance and immunizations reviewed. Please refer to Health maintenance section.  Return to care in 1 year for next preventative visit.     Subjective:  HPI:  She has no acute complaints today.   Lifestyle Diet: None.  Exercise: None due to covid.   Depression screen PHQ 2/9 02/28/2019  Decreased Interest 0  Down, Depressed, Hopeless 0  PHQ - 2 Score 0    Health Maintenance Due  Topic Date Due  . TETANUS/TDAP  04/18/2019     ROS: Per HPI, otherwise  a complete review of systems was negative.   PMH:  The following were reviewed and entered/updated in epic: Past Medical History:  Diagnosis Date  . Thyroid disease    Goiter   Patient Active Problem List   Diagnosis Date Noted  . Dyslipidemia 08/23/2018  . Migraine headache with aura 03/30/2014  . Tachycardia 12/12/2011  . Hypothyroidism 04/14/2007  . OSTEOPOROSIS 04/14/2007   Past Surgical History:  Procedure Laterality Date  . BREAST SURGERY     right lump  . CATARACT EXTRACTION, BILATERAL     Dr Bing Plume  . thyroid    . TUBAL LIGATION      Family History  Problem Relation Age of Onset  . Colon polyps Father 89  . Arthritis Other   . Cancer Other        colon, prostate  . Heart disease Mother        CHF, died age 76  . Lung cancer Brother   . Stomach cancer Neg Hx     Medications- reviewed and updated Current Outpatient Medications  Medication Sig Dispense Refill  . EUTHYROX 75 MCG tablet TAKE 1 TABLET BY MOUTH ONCE DAILY BEFORE BREAKFAST 90 tablet 0  . metoprolol succinate (TOPROL-XL) 25 MG 24 hr tablet Take 1 tablet by mouth once daily 90 tablet 0  . traMADol (ULTRAM) 50 MG tablet TAKE 1/2-1 TABLET BY MOUTH EVERY 6 TO 8 HOURS AS NEEDED FOR PAIN 40 tablet 5   No current facility-administered medications for this visit.    Allergies-reviewed and updated No Known Allergies  Social History   Socioeconomic History  . Marital status: Legally Separated    Spouse name: Not on file  . Number of children: 2  .  Years of education: Not on file  . Highest education level: Not on file  Occupational History  . Not on file  Tobacco Use  . Smoking status: Never Smoker  . Smokeless tobacco: Never Used  Substance and Sexual Activity  . Alcohol use: No  . Drug use: No  . Sexual activity: Not on file  Other Topics Concern  . Not on file  Social History Narrative   Lives with husband, son and grandson.   Social Determinants of Health   Financial Resource  Strain:   . Difficulty of Paying Living Expenses: Not on file  Food Insecurity:   . Worried About Charity fundraiser in the Last Year: Not on file  . Ran Out of Food in the Last Year: Not on file  Transportation Needs:   . Lack of Transportation (Medical): Not on file  . Lack of Transportation (Non-Medical): Not on file  Physical Activity:   . Days of Exercise per Week: Not on file  . Minutes of Exercise per Session: Not on file  Stress:   . Feeling of Stress : Not on file  Social Connections:   . Frequency of Communication with Friends and Family: Not on file  . Frequency of Social Gatherings with Friends and Family: Not on file  . Attends Religious Services: Not on file  . Active Member of Clubs or Organizations: Not on file  . Attends Archivist Meetings: Not on file  . Marital Status: Not on file        Objective:  Physical Exam: BP 118/80   Pulse 83   Temp 97.6 F (36.4 C)   Ht 5\' 4"  (1.626 m)   Wt 135 lb (61.2 kg)   SpO2 97%   BMI 23.17 kg/m   Body mass index is 23.17 kg/m. Wt Readings from Last 3 Encounters:  08/01/19 135 lb (61.2 kg)  02/28/19 134 lb 12.8 oz (61.1 kg)  09/15/18 138 lb 9.6 oz (62.9 kg)   Gen: NAD, resting comfortably HEENT: TMs normal bilaterally. OP clear. No thyromegaly noted.  CV: RRR with no murmurs appreciated Pulm: NWOB, CTAB with no crackles, wheezes, or rhonchi GI: Normal bowel sounds present. Soft, Nontender, Nondistended. MSK: no edema, cyanosis, or clubbing noted Skin: warm, dry Neuro: CN2-12 grossly intact. Strength 5/5 in upper and lower extremities. Reflexes symmetric and intact bilaterally.  Psych: Normal affect and thought content     Natalie [name] M. Jerline Pain, MD 08/01/2019 2:07 PM

## 2019-08-02 NOTE — Progress Notes (Signed)
Please inform patient of the following:  Cholesterol level up slightly since last year. All of her other blood work is stable. Do not need to make any changes at this time. Recommend that she come back in 4-6 weeks to recheck TSH as we discussed at her office visit. Please place future order.

## 2019-08-03 ENCOUNTER — Other Ambulatory Visit: Payer: Self-pay

## 2019-08-03 DIAGNOSIS — E039 Hypothyroidism, unspecified: Secondary | ICD-10-CM

## 2019-08-17 DIAGNOSIS — L820 Inflamed seborrheic keratosis: Secondary | ICD-10-CM | POA: Diagnosis not present

## 2019-08-17 DIAGNOSIS — L814 Other melanin hyperpigmentation: Secondary | ICD-10-CM | POA: Diagnosis not present

## 2019-08-17 DIAGNOSIS — D485 Neoplasm of uncertain behavior of skin: Secondary | ICD-10-CM | POA: Diagnosis not present

## 2019-08-17 DIAGNOSIS — D225 Melanocytic nevi of trunk: Secondary | ICD-10-CM | POA: Diagnosis not present

## 2019-08-17 DIAGNOSIS — L280 Lichen simplex chronicus: Secondary | ICD-10-CM | POA: Diagnosis not present

## 2019-08-17 DIAGNOSIS — L821 Other seborrheic keratosis: Secondary | ICD-10-CM | POA: Diagnosis not present

## 2019-08-17 DIAGNOSIS — D2371 Other benign neoplasm of skin of right lower limb, including hip: Secondary | ICD-10-CM | POA: Diagnosis not present

## 2019-08-17 DIAGNOSIS — L438 Other lichen planus: Secondary | ICD-10-CM | POA: Diagnosis not present

## 2019-09-07 ENCOUNTER — Other Ambulatory Visit: Payer: Self-pay

## 2019-09-08 ENCOUNTER — Ambulatory Visit: Payer: Medicare HMO | Admitting: Family Medicine

## 2019-09-08 ENCOUNTER — Other Ambulatory Visit (INDEPENDENT_AMBULATORY_CARE_PROVIDER_SITE_OTHER): Payer: Medicare HMO

## 2019-09-08 ENCOUNTER — Other Ambulatory Visit: Payer: Medicare HMO

## 2019-09-08 DIAGNOSIS — H04123 Dry eye syndrome of bilateral lacrimal glands: Secondary | ICD-10-CM | POA: Diagnosis not present

## 2019-09-08 DIAGNOSIS — H18503 Unspecified hereditary corneal dystrophies, bilateral: Secondary | ICD-10-CM | POA: Diagnosis not present

## 2019-09-08 DIAGNOSIS — E039 Hypothyroidism, unspecified: Secondary | ICD-10-CM

## 2019-09-08 DIAGNOSIS — Z961 Presence of intraocular lens: Secondary | ICD-10-CM | POA: Diagnosis not present

## 2019-09-08 DIAGNOSIS — H43813 Vitreous degeneration, bilateral: Secondary | ICD-10-CM | POA: Diagnosis not present

## 2019-09-08 LAB — TSH: TSH: 0.73 u[IU]/mL (ref 0.35–4.50)

## 2019-09-08 NOTE — Progress Notes (Signed)
Please inform patient of the following:  Thyroid levels are NORMAL. Would like for her to continue using her current dose and we can recheck with her next blood draw.  Algis Greenhouse. Jerline Pain, MD 09/08/2019 3:50 PM

## 2019-09-21 DIAGNOSIS — L820 Inflamed seborrheic keratosis: Secondary | ICD-10-CM | POA: Diagnosis not present

## 2019-09-21 DIAGNOSIS — L821 Other seborrheic keratosis: Secondary | ICD-10-CM | POA: Diagnosis not present

## 2019-10-10 ENCOUNTER — Other Ambulatory Visit: Payer: Self-pay

## 2019-10-10 ENCOUNTER — Telehealth: Payer: Self-pay | Admitting: Family Medicine

## 2019-10-10 MED ORDER — SUMATRIPTAN SUCCINATE 25 MG PO TABS: 25.0000 mg | ORAL_TABLET | ORAL | 0 refills | Status: DC | PRN

## 2019-10-10 NOTE — Addendum Note (Signed)
Addended by: Loralyn Freshwater on: 10/10/2019 04:26 PM   Modules accepted: Orders

## 2019-10-10 NOTE — Telephone Encounter (Signed)
Patient wants to know if you can prescribe a Rx for Migraine,patient does not want a OV

## 2019-10-10 NOTE — Telephone Encounter (Signed)
Patient calling in and states she had a migraine for the last 4 days. Patient would like to see if Dr. Jerline Pain could send something in. Please advise.

## 2019-10-10 NOTE — Telephone Encounter (Signed)
She has been on imitrex in the past - ok with sending that in if she does not have any.  Algis Greenhouse. Jerline Pain, MD 10/10/2019 4:05 PM

## 2019-10-10 NOTE — Telephone Encounter (Signed)
Rx sent patient notified 

## 2019-10-18 ENCOUNTER — Telehealth: Payer: Self-pay

## 2019-10-18 ENCOUNTER — Telehealth: Payer: Self-pay | Admitting: Family Medicine

## 2019-10-18 NOTE — Telephone Encounter (Signed)
Error

## 2019-10-18 NOTE — Telephone Encounter (Signed)
Nurse Assessment Nurse: Sumner Boast, RN, [place] Derry Date/Time Natalie [name] Time): 10/18/2019 12:26:17 PM Confirm and document reason for call. If symptomatic, describe symptoms. ---Caller states she has a headache and BP of 151/71. Last night B/P 159/77. No dizziness. Headache started last week. Has Hypertension and takes Metaprolol. No fever. Headache is an 8 out of 10 pain scale. Took Tylenol ES for headache. Has the patient had close contact with a person known or suspected to have the novel coronavirus illness OR traveled / lives in area with major community spread (including international travel) in the last 14 days from the onset of symptoms? * If Asymptomatic, screen for exposure and travel within the last 14 days. ---No Does the patient have any new or worsening symptoms? ---Yes Will a triage be completed? ---Yes Related visit to physician within the last 2 weeks? ---No Does the PT have any chronic conditions? (i.e. diabetes, asthma, this includes High risk factors for pregnancy, etc.) ---Yes List chronic conditions. ---Hypertension Hypothyroidism Is this a behavioral health or substance abuse call? ---No Guidelines Guideline Title Affirmed Question Affirmed Notes Nurse Date/Time (Eastern Time) Blood Pressure - High Systolic BP >= 0000000 OR Diastolic >= 123XX123 [name], [name] 10/18/2019 12:32:32 PM Disp. Time (Eastern Time) Disposition Final UserPLEASE NOTE: All timestamps contained within this report are represented as [place] Standard Time. CONFIDENTIALTY NOTICE: This fax transmission is intended only for the addressee. It contains information that is legally privileged, confidential or otherwise protected from use or disclosure. If you are not the intended recipient, you are strictly prohibited from reviewing, disclosing, copying using or disseminating any of this information or taking any action in reliance on or regarding this information. If you have received this fax in error, please  notify us immediately by telephone so that we can arrange for its return to [place]. Phone: [phone], Toll-Free: [phone], Fax: [phone] Page: 2 of 2 Call Id: [id number] 10/18/2019 12:35:44 PM SEE PCP WITHIN 3 DAYS Yes Sumner Boast, RN, [name] Disagree/Comply Comply Caller Understands Yes PreDisposition Call Doctor Care Advice Given Per Guideline SEE PCP WITHIN 3 DAYS: CALL BACK IF: * You become worse. CARE ADVICE given per High Blood Pressure (Adult) guideline. * Your blood pressure is over 180/110

## 2019-10-19 ENCOUNTER — Other Ambulatory Visit: Payer: Self-pay | Admitting: Family Medicine

## 2019-10-19 NOTE — Telephone Encounter (Signed)
Patient has appt scheduled 10/20/2019

## 2019-10-20 ENCOUNTER — Ambulatory Visit (INDEPENDENT_AMBULATORY_CARE_PROVIDER_SITE_OTHER): Payer: Medicare HMO | Admitting: Family Medicine

## 2019-10-20 ENCOUNTER — Encounter: Payer: Self-pay | Admitting: Family Medicine

## 2019-10-20 ENCOUNTER — Other Ambulatory Visit: Payer: Self-pay

## 2019-10-20 VITALS — BP 140/88 | HR 67 | Temp 98.0°F | Ht 64.0 in | Wt 144.0 lb

## 2019-10-20 DIAGNOSIS — G43101 Migraine with aura, not intractable, with status migrainosus: Secondary | ICD-10-CM | POA: Diagnosis not present

## 2019-10-20 MED ORDER — PREDNISONE 50 MG PO TABS: ORAL_TABLET | ORAL | 0 refills | Status: DC

## 2019-10-20 MED ORDER — CYCLOBENZAPRINE HCL 5 MG PO TABS: 5.0000 mg | ORAL_TABLET | Freq: Three times a day (TID) | ORAL | 0 refills | Status: DC | PRN

## 2019-10-20 NOTE — Assessment & Plan Note (Signed)
Acute flare likely due to muscle tightness in the left neck. No red flags. Will start prednisone burst as this is worked well for her in the past.  Also start Flexeril to help with the muscle tightness.  Discussed home exercises and handout was given to prevent neck spasms.  Offered Toradol and methylprednisolone injection today however patient deferred.  Improving the next 2 days he will come back for this.

## 2019-10-20 NOTE — Progress Notes (Signed)
   Natalie [name] is a 74 y.o. female who presents today for an office visit.  Assessment/Plan:  Chronic Problems Addressed Today: Migraine headache with aura Acute flare likely due to muscle tightness in the left neck. No red flags. Will start prednisone burst as this is worked well for her in the past.  Also start Flexeril to help with the muscle tightness.  Discussed home exercises and handout was given to prevent neck spasms.  Offered Toradol and methylprednisolone injection today however patient deferred.  Improving the next 2 days he will come back for this.     Subjective:  HPI:  Patient has had a flareup of her migraine.  Started about a week ago.  Initially located his pain in her left neck.  All the back of her head.  Feels similar to previous migraines.  Has tried Imitrex with modest improvement.  She has had prednisone for similar situation in the past which is worked well.  Some associated nausea.  Also some photophobia.  No reported weakness or numbness.       Objective:  Physical Exam: BP 140/88 (BP Location: Left Arm, Patient Position: Sitting, Cuff Size: Normal)   Pulse 67   Temp 98 F (36.7 C) (Temporal)   Ht 5\' 4"  (1.626 m)   Wt 144 lb (65.3 kg)   SpO2 98%   BMI 24.72 kg/m   Wt Readings from Last 3 Encounters:  10/20/19 144 lb (65.3 kg)  08/01/19 135 lb (61.2 kg)  02/28/19 134 lb 12.8 oz (61.1 kg)    Gen: No acute distress, resting comfortably CV: Regular rate and rhythm with no murmurs appreciated Pulm: Normal work of breathing, clear to auscultation bilaterally with no crackles, wheezes, or rhonchi Neuro: Grossly normal, moves all extremities.  Cranial nerves II through XII intact. MSK: Neck with tightness on left side. Psych: Normal affect and thought content      Natalie [name] M. Jerline Pain, MD 10/20/2019 11:10 AM

## 2019-10-20 NOTE — Patient Instructions (Signed)
It was very nice to see you today!  The tightness in your neck is probably causing her migraines.  Please start muscle relaxer and prednisone.  Please let us know if not improving in the next several days.  Take care, Dr Jerline Pain  Please try these tips to maintain a healthy lifestyle:   Eat at least 3 REAL meals and 1-2 snacks per day.  Aim for no more than 5 hours between eating.  If you eat breakfast, please do so within one hour of getting up.    Each meal should contain half fruits/vegetables, one quarter protein, and one quarter carbs (no bigger than a computer mouse)   Cut down on sweet beverages. This includes juice, soda, and sweet tea.     Drink at least 1 glass of water with each meal and aim for at least 8 glasses per day   Exercise at least 150 minutes every week.

## 2019-12-27 ENCOUNTER — Other Ambulatory Visit: Payer: Self-pay | Admitting: Family Medicine

## 2020-01-13 DIAGNOSIS — M8580 Other specified disorders of bone density and structure, unspecified site: Secondary | ICD-10-CM | POA: Diagnosis not present

## 2020-01-13 DIAGNOSIS — Z124 Encounter for screening for malignant neoplasm of cervix: Secondary | ICD-10-CM | POA: Diagnosis not present

## 2020-01-13 DIAGNOSIS — Z01419 Encounter for gynecological examination (general) (routine) without abnormal findings: Secondary | ICD-10-CM | POA: Diagnosis not present

## 2020-01-13 DIAGNOSIS — Z1231 Encounter for screening mammogram for malignant neoplasm of breast: Secondary | ICD-10-CM | POA: Diagnosis not present

## 2020-01-13 LAB — HM MAMMOGRAPHY

## 2020-02-03 DIAGNOSIS — L820 Inflamed seborrheic keratosis: Secondary | ICD-10-CM | POA: Diagnosis not present

## 2020-02-21 ENCOUNTER — Telehealth: Payer: Self-pay | Admitting: Family Medicine

## 2020-02-21 ENCOUNTER — Telehealth: Payer: Self-pay

## 2020-02-21 DIAGNOSIS — H0288A Meibomian gland dysfunction right eye, upper and lower eyelids: Secondary | ICD-10-CM | POA: Diagnosis not present

## 2020-02-21 DIAGNOSIS — H18593 Other hereditary corneal dystrophies, bilateral: Secondary | ICD-10-CM | POA: Diagnosis not present

## 2020-02-21 DIAGNOSIS — H0288B Meibomian gland dysfunction left eye, upper and lower eyelids: Secondary | ICD-10-CM | POA: Diagnosis not present

## 2020-02-21 DIAGNOSIS — H16403 Unspecified corneal neovascularization, bilateral: Secondary | ICD-10-CM | POA: Diagnosis not present

## 2020-02-21 NOTE — Telephone Encounter (Signed)
Patient states she is always tired and sleeping a lot and would like to get her thyroid checked.

## 2020-02-21 NOTE — Telephone Encounter (Signed)
Pt is experiencing ear pressure, pain under eyes, stuffy nose, and headache. She believes she has something sinus or allergy related. I tried to set PT up for a virtual visit, but she denied. She asked if it was possible if Dr. Jerline Pain could call her in something for allergies, as the zyrtec that she is taking isn't working. Told pt I would ask.

## 2020-02-22 NOTE — Telephone Encounter (Signed)
appt scheduled for 23

## 2020-02-22 NOTE — Telephone Encounter (Signed)
Called patient and LVM to schedule.

## 2020-02-22 NOTE — Telephone Encounter (Signed)
Please call patient to schedule a OV

## 2020-02-22 NOTE — Telephone Encounter (Signed)
Please advise 

## 2020-02-22 NOTE — Telephone Encounter (Signed)
Left voice message for patient to call clinic. Please advise

## 2020-02-23 ENCOUNTER — Other Ambulatory Visit: Payer: Self-pay

## 2020-02-23 DIAGNOSIS — J329 Chronic sinusitis, unspecified: Secondary | ICD-10-CM

## 2020-02-23 MED ORDER — AZELASTINE HCL 0.1 % NA SOLN: 2.0000 | Freq: Two times a day (BID) | NASAL | 1 refills | Status: DC | PRN

## 2020-02-23 NOTE — Telephone Encounter (Signed)
Patient notified Rx sent

## 2020-02-23 NOTE — Progress Notes (Signed)
z

## 2020-02-23 NOTE — Telephone Encounter (Signed)
Ok to send in astelin nasal spray.  Algis Greenhouse. Jerline Pain, MD 02/23/2020 12:52 PM

## 2020-02-27 ENCOUNTER — Telehealth: Payer: Self-pay | Admitting: Family Medicine

## 2020-02-27 NOTE — Telephone Encounter (Signed)
Left message for patient to call back and schedule Medicare Annual Wellness Visit (AWV) either virtually/audio only OR in office. Whatever the patients preference is.  Last AWV 02/28/19; please schedule 02/29/20 OR AFTER with [facility]Nurse Health Advisor at [place].  This should be a 45 minute visit.

## 2020-03-01 DIAGNOSIS — Z0100 Encounter for examination of eyes and vision without abnormal findings: Secondary | ICD-10-CM | POA: Diagnosis not present

## 2020-03-05 ENCOUNTER — Other Ambulatory Visit: Payer: Self-pay

## 2020-03-05 ENCOUNTER — Ambulatory Visit (INDEPENDENT_AMBULATORY_CARE_PROVIDER_SITE_OTHER): Payer: Medicare HMO | Admitting: Family Medicine

## 2020-03-05 ENCOUNTER — Encounter: Payer: Self-pay | Admitting: Family Medicine

## 2020-03-05 VITALS — BP 121/72 | HR 79 | Temp 98.1°F | Ht 64.0 in | Wt 141.4 lb

## 2020-03-05 DIAGNOSIS — E039 Hypothyroidism, unspecified: Secondary | ICD-10-CM

## 2020-03-05 DIAGNOSIS — R000 Tachycardia, unspecified: Secondary | ICD-10-CM

## 2020-03-05 NOTE — Progress Notes (Signed)
   Natalie [name] is a 74 y.o. female who presents today for an office visit.  Assessment/Plan:  Chronic Problems Addressed Today: Hypothyroidism Has had to deal with a few brand changes of levothyroxine.  Currently feels like her thyroid levels are off.  Will check TSH today.  Continue Euthyrox 75 mcg daily.  Tachycardia Hear rate at goal. Continue metoprolol succinate 25 mg daily.  Beta-blocker could be contributing to fatigue.  If labs are normal would consider decreasing dose.    Subjective:  HPI:  See A/p.  She has been having some fatigue issues.  Feels like her thyroid level is off.  Would like to have blood work done today.       Objective:  Physical Exam: BP 121/72   Pulse 79   Temp 98.1 F (36.7 C) (Temporal)   Ht 5\' 4"  (1.626 m)   Wt 141 lb 6.4 oz (64.1 kg)   SpO2 98%   BMI 24.27 kg/m   Gen: No acute distress, resting comfortably CV: Regular rate and rhythm with no murmurs appreciated Pulm: Normal work of breathing, clear to auscultation bilaterally with no crackles, wheezes, or rhonchi Neuro: Grossly normal, moves all extremities Psych: Normal affect and thought content      Natalie [name] M. Jerline Pain, MD 03/05/2020 3:31 PM

## 2020-03-05 NOTE — Assessment & Plan Note (Signed)
Hear rate at goal. Continue metoprolol succinate 25 mg daily.  Beta-blocker could be contributing to fatigue.  If labs are normal would consider decreasing dose.

## 2020-03-05 NOTE — Assessment & Plan Note (Signed)
Has had to deal with a few brand changes of levothyroxine.  Currently feels like her thyroid levels are off.  Will check TSH today.  Continue Euthyrox 75 mcg daily.

## 2020-03-05 NOTE — Patient Instructions (Signed)
It was very nice to see you today!  We will check blood work today.  Please check with your insurance about coverage for Synthroid.  Take care, Dr Jerline Pain  Please try these tips to maintain a healthy lifestyle:   Eat at least 3 REAL meals and 1-2 snacks per day.  Aim for no more than 5 hours between eating.  If you eat breakfast, please do so within one hour of getting up.    Each meal should contain half fruits/vegetables, one quarter protein, and one quarter carbs (no bigger than a computer mouse)   Cut down on sweet beverages. This includes juice, soda, and sweet tea.     Drink at least 1 glass of water with each meal and aim for at least 8 glasses per day   Exercise at least 150 minutes every week.

## 2020-03-06 LAB — CBC
HCT: 37.7 % (ref 35.0–45.0)
Hemoglobin: 12.9 g/dL (ref 11.7–15.5)
MCH: 30.6 pg (ref 27.0–33.0)
MCHC: 34.2 g/dL (ref 32.0–36.0)
MCV: 89.5 fL (ref 80.0–100.0)
MPV: 10.3 fL (ref 7.5–12.5)
Platelets: 294 10*3/uL (ref 140–400)
RBC: 4.21 10*6/uL (ref 3.80–5.10)
RDW: 12.9 % (ref 11.0–15.0)
WBC: 7.7 10*3/uL (ref 3.8–10.8)

## 2020-03-06 LAB — COMPREHENSIVE METABOLIC PANEL
AG Ratio: 1.2 (calc) (ref 1.0–2.5)
ALT: 14 U/L (ref 6–29)
AST: 17 U/L (ref 10–35)
Albumin: 3.9 g/dL (ref 3.6–5.1)
Alkaline phosphatase (APISO): 92 U/L (ref 37–153)
BUN: 11 mg/dL (ref 7–25)
CO2: 27 mmol/L (ref 20–32)
Calcium: 9.6 mg/dL (ref 8.6–10.4)
Chloride: 101 mmol/L (ref 98–110)
Creat: 0.82 mg/dL (ref 0.60–0.93)
Globulin: 3.2 g/dL (calc) (ref 1.9–3.7)
Glucose, Bld: 111 mg/dL — ABNORMAL HIGH (ref 65–99)
Potassium: 4 mmol/L (ref 3.5–5.3)
Sodium: 138 mmol/L (ref 135–146)
Total Bilirubin: 0.5 mg/dL (ref 0.2–1.2)
Total Protein: 7.1 g/dL (ref 6.1–8.1)

## 2020-03-06 LAB — TSH: TSH: 0.71 mIU/L (ref 0.40–4.50)

## 2020-03-06 NOTE — Progress Notes (Signed)
Please inform patient of the following:  Labs are NORMAL. Thyroid level is normal. Would like for her to let us know if her fatigue is still bothersome and we can discuss decreasing her dose of metoprolol.  Algis Greenhouse. Jerline Pain, MD 03/06/2020 10:56 AM

## 2020-04-06 DIAGNOSIS — L243 Irritant contact dermatitis due to cosmetics: Secondary | ICD-10-CM | POA: Diagnosis not present

## 2020-04-18 ENCOUNTER — Other Ambulatory Visit: Payer: Self-pay | Admitting: Family Medicine

## 2020-05-22 ENCOUNTER — Ambulatory Visit (INDEPENDENT_AMBULATORY_CARE_PROVIDER_SITE_OTHER): Payer: Medicare HMO

## 2020-05-22 ENCOUNTER — Encounter: Payer: Self-pay | Admitting: Family Medicine

## 2020-05-22 DIAGNOSIS — Z23 Encounter for immunization: Secondary | ICD-10-CM | POA: Diagnosis not present

## 2020-07-17 ENCOUNTER — Other Ambulatory Visit: Payer: Self-pay | Admitting: Family Medicine

## 2020-07-24 DIAGNOSIS — H0231 Blepharochalasis right upper eyelid: Secondary | ICD-10-CM | POA: Diagnosis not present

## 2020-08-13 ENCOUNTER — Other Ambulatory Visit: Payer: Self-pay

## 2020-08-13 ENCOUNTER — Ambulatory Visit (INDEPENDENT_AMBULATORY_CARE_PROVIDER_SITE_OTHER): Payer: Medicare Other | Admitting: Family Medicine

## 2020-08-13 ENCOUNTER — Encounter: Payer: Self-pay | Admitting: Family Medicine

## 2020-08-13 VITALS — BP 113/72 | HR 63 | Temp 97.3°F | Ht 64.0 in | Wt 140.0 lb

## 2020-08-13 DIAGNOSIS — Z0001 Encounter for general adult medical examination with abnormal findings: Secondary | ICD-10-CM | POA: Diagnosis not present

## 2020-08-13 DIAGNOSIS — E538 Deficiency of other specified B group vitamins: Secondary | ICD-10-CM | POA: Insufficient documentation

## 2020-08-13 DIAGNOSIS — R739 Hyperglycemia, unspecified: Secondary | ICD-10-CM | POA: Diagnosis not present

## 2020-08-13 DIAGNOSIS — E785 Hyperlipidemia, unspecified: Secondary | ICD-10-CM

## 2020-08-13 DIAGNOSIS — E559 Vitamin D deficiency, unspecified: Secondary | ICD-10-CM | POA: Insufficient documentation

## 2020-08-13 DIAGNOSIS — G43101 Migraine with aura, not intractable, with status migrainosus: Secondary | ICD-10-CM

## 2020-08-13 DIAGNOSIS — Z79899 Other long term (current) drug therapy: Secondary | ICD-10-CM

## 2020-08-13 DIAGNOSIS — E039 Hypothyroidism, unspecified: Secondary | ICD-10-CM

## 2020-08-13 LAB — COMPREHENSIVE METABOLIC PANEL
ALT: 13 U/L (ref 0–35)
AST: 15 U/L (ref 0–37)
Albumin: 4 g/dL (ref 3.5–5.2)
Alkaline Phosphatase: 87 U/L (ref 39–117)
BUN: 15 mg/dL (ref 6–23)
CO2: 27 mEq/L (ref 19–32)
Calcium: 9.2 mg/dL (ref 8.4–10.5)
Chloride: 103 mEq/L (ref 96–112)
Creatinine, Ser: 0.74 mg/dL (ref 0.40–1.20)
GFR: 80.55 mL/min (ref >60.00)
Glucose, Bld: 91 mg/dL (ref 70–99)
Potassium: 4.1 mEq/L (ref 3.5–5.1)
Sodium: 138 mEq/L (ref 135–145)
Total Bilirubin: 0.5 mg/dL (ref 0.2–1.2)
Total Protein: 7.4 g/dL (ref 6.0–8.3)

## 2020-08-13 LAB — TSH: TSH: 0.79 u[IU]/mL (ref 0.35–4.50)

## 2020-08-13 LAB — CBC
HCT: 40.6 % (ref 36.0–46.0)
Hemoglobin: 13.5 g/dL (ref 12.0–15.0)
MCHC: 33.2 g/dL (ref 30.0–36.0)
MCV: 89.7 fl (ref 78.0–100.0)
Platelets: 279 10*3/uL (ref 150.0–400.0)
RBC: 4.52 Mil/uL (ref 3.87–5.11)
RDW: 12.9 % (ref 11.5–15.5)
WBC: 6.8 10*3/uL (ref 4.0–10.5)

## 2020-08-13 LAB — LIPID PANEL
Cholesterol: 197 mg/dL (ref 0–200)
HDL: 44.7 mg/dL (ref >39.00)
LDL Cholesterol: 123 mg/dL — ABNORMAL HIGH (ref 0–99)
NonHDL: 152.31
Total CHOL/HDL Ratio: 4
Triglycerides: 149 mg/dL (ref 0.0–149.0)
VLDL: 29.8 mg/dL (ref 0.0–40.0)

## 2020-08-13 LAB — VITAMIN B12: Vitamin B-12: 193 pg/mL — ABNORMAL LOW (ref 211–911)

## 2020-08-13 LAB — VITAMIN D 25 HYDROXY (VIT D DEFICIENCY, FRACTURES): VITD: 34.71 ng/mL (ref 30.00–100.00)

## 2020-08-13 LAB — HEMOGLOBIN A1C: Hgb A1c MFr Bld: 5.8 % (ref 4.6–6.5)

## 2020-08-13 MED ORDER — SYNTHROID 75 MCG PO TABS: 75.0000 ug | ORAL_TABLET | Freq: Every day | ORAL | 3 refills | Status: DC

## 2020-08-13 NOTE — Progress Notes (Signed)
Please inform patient of the following:  B12 is low.  This could explain some of her fatigue issues.  Recommend she start B12 protocol here.  Cholesterol and blood sugar borderline.  Do not need to start meds but she should continue working on diet and exercise and we can recheck in 1 year. Thyroid level is stable.  She can continue her dose of Synthroid 75 mcg daily.  We sent this in earlier today. Vitamin D is within normal range.  Should continue her current dose of vitamin D supplementation.  Everything else is normal.

## 2020-08-13 NOTE — Assessment & Plan Note (Signed)
Check lipids.  Continue lifestyle modifications. 

## 2020-08-13 NOTE — Assessment & Plan Note (Signed)
Stable on Imitrex as needed.

## 2020-08-13 NOTE — Patient Instructions (Signed)
It was very nice to see you today!  I will send in a refill on your thyroid medication.  I will send in the Synthroid.  Please wait on taking it up until we get your lab results back.  We will check other labs today including your cholesterol, B12, blood counts, and vitamin D levels.  Please keep up the good work with your diet and exercise.  I will see you back in a year.  Please back to see me sooner if needed.  Take care, Dr Jerline Pain  Please try these tips to maintain a healthy lifestyle:   Eat at least 3 REAL meals and 1-2 snacks per day.  Aim for no more than 5 hours between eating.  If you eat breakfast, please do so within one hour of getting up.    Each meal should contain half fruits/vegetables, one quarter protein, and one quarter carbs (no bigger than a computer mouse)   Cut down on sweet beverages. This includes juice, soda, and sweet tea.     Drink at least 1 glass of water with each meal and aim for at least 8 glasses per day   Exercise at least 150 minutes every week.    Preventive Care 50 Years and Older, Female Preventive care refers to lifestyle choices and visits with your health care provider that can promote health and wellness. This includes:  A yearly physical exam. This is also called an annual wellness visit.  Regular dental and eye exams.  Immunizations.  Screening for certain conditions.  Healthy lifestyle choices, such as: ? Eating a healthy diet. ? Getting regular exercise. ? Not using drugs or products that contain nicotine and tobacco. ? Limiting alcohol use. What can I expect for my preventive care visit? Physical exam Your health care provider will check your:  Height and weight. These may be used to calculate your BMI (body mass index). BMI is a measurement that tells if you are at a healthy weight.  Heart rate and blood pressure.  Body temperature.  Skin for abnormal spots. Counseling Your health care provider may ask you  questions about your:  Past medical problems.  Family's medical history.  Alcohol, tobacco, and drug use.  Emotional well-being.  Home life and relationship well-being.  Sexual activity.  Diet, exercise, and sleep habits.  History of falls.  Memory and ability to understand (cognition).  Work and work Statistician.  Pregnancy and menstrual history.  Access to firearms. What immunizations do I need? Vaccines are usually given at various ages, according to a schedule. Your health care provider will recommend vaccines for you based on your age, medical history, and lifestyle or other factors, such as travel or where you work.   What tests do I need? Blood tests  Lipid and cholesterol levels. These may be checked every 5 years, or more often depending on your overall health.  Hepatitis C test.  Hepatitis B test. Screening  Lung cancer screening. You may have this screening every year starting at age 70 if you have a 30-pack-year history of smoking and currently smoke or have quit within the past 15 years.  Colorectal cancer screening. ? All adults should have this screening starting at age 12 and continuing until age 47. ? Your health care provider may recommend screening at age 39 if you are at increased risk. ? You will have tests every 1-10 years, depending on your results and the type of screening test.  Diabetes screening. ? This is done  by checking your blood sugar (glucose) after you have not eaten for a while (fasting). ? You may have this done every 1-3 years.  Mammogram. ? This may be done every 1-2 years. ? Talk with your health care provider about how often you should have regular mammograms.  Abdominal aortic aneurysm (AAA) screening. You may need this if you are a current or former smoker.  BRCA-related cancer screening. This may be done if you have a family history of breast, ovarian, tubal, or peritoneal cancers. Other tests  STD (sexually  transmitted disease) testing, if you are at risk.  Bone density scan. This is done to screen for osteoporosis. You may have this done starting at age 54. Talk with your health care provider about your test results, treatment options, and if necessary, the need for more tests. Follow these instructions at home: Eating and drinking  Eat a diet that includes fresh fruits and vegetables, whole grains, lean protein, and low-fat dairy products. Limit your intake of foods with high amounts of sugar, saturated fats, and salt.  Take vitamin and mineral supplements as recommended by your health care provider.  Do not drink alcohol if your health care provider tells you not to drink.  If you drink alcohol: ? Limit how much you have to 0-1 drink a day. ? Be aware of how much alcohol is in your drink. In the [place] one drink equals one 12 oz bottle of beer (355 mL), one 5 oz glass of wine (148 mL), or one 1 oz glass of hard liquor (44 mL).   Lifestyle  Take daily care of your teeth and gums. Brush your teeth every morning and night with fluoride toothpaste. Floss one time each day.  Stay active. Exercise for at least 30 minutes 5 or more days each week.  Do not use any products that contain nicotine or tobacco, such as cigarettes, e-cigarettes, and chewing tobacco. If you need help quitting, ask your health care provider.  Do not use drugs.  If you are sexually active, practice safe sex. Use a condom or other form of protection in order to prevent STIs (sexually transmitted infections).  Talk with your health care provider about taking a low-dose aspirin or statin.  Find healthy ways to cope with stress, such as: ? Meditation, yoga, or listening to music. ? Journaling. ? Talking to a trusted person. ? Spending time with friends and family. Safety  Always wear your seat belt while driving or riding in a vehicle.  Do not drive: ? If you have been drinking alcohol. Do not ride with someone  who has been drinking. ? When you are tired or distracted. ? While texting.  Wear a helmet and other protective equipment during sports activities.  If you have firearms in your house, make sure you follow all gun safety procedures. What's next?  Visit your health care provider once a year for an annual wellness visit.  Ask your health care provider how often you should have your eyes and teeth checked.  Stay up to date on all vaccines. This information is not intended to replace advice given to you by your health care provider. Make sure you discuss any questions you have with your health care provider. Document Revised: 06/20/2020 Document Reviewed: 06/24/2018 Elsevier Patient Education  2021 [employer].

## 2020-08-13 NOTE — Assessment & Plan Note (Signed)
Check TSH.  We will switch back to brand-name Synthroid as she has done well with this in the past.

## 2020-08-13 NOTE — Assessment & Plan Note (Signed)
Check vitamin D.  She is on 1000 IU daily.

## 2020-08-13 NOTE — Progress Notes (Signed)
Chief Complaint:  Natalie [name] is a 75 y.o. female who presents today for her annual comprehensive physical exam.    Assessment/Plan:  New/Acute Problems: Fatigue Possibly thyroid related.  Will check labs today.  She is sleeping well.  Chronic Problems Addressed Today: Migraine headache with aura Stable on Imitrex as needed.  Hypothyroidism Check TSH.  We will switch back to brand-name Synthroid as she has done well with this in the past.  Vitamin D deficiency Check vitamin D.  She is on 1000 IU daily.  Dyslipidemia Check lipids.  Continue lifestyle modifications.  Preventative Healthcare: Check labs.  Advised her to check with pharmacy regarding shingles and Tdap vaccine.  Gets mammogram and DEXA through OB/GYN.  Patient Counseling(The following topics were reviewed and/or handout was given):  -Nutrition: Stressed importance of moderation in sodium/caffeine intake, saturated fat and cholesterol, caloric balance, sufficient intake of fresh fruits, vegetables, and fiber.  -Stressed the importance of regular exercise.   -Substance Abuse: Discussed cessation/primary prevention of tobacco, alcohol, or other drug use; driving or other dangerous activities under the influence; availability of treatment for abuse.   -Injury prevention: Discussed safety belts, safety helmets, smoke detector, smoking near bedding or upholstery.   -Sexuality: Discussed sexually transmitted diseases, partner selection, use of condoms, avoidance of unintended pregnancy and contraceptive alternatives.   -Dental health: Discussed importance of regular tooth brushing, flossing, and dental visits.  -Health maintenance and immunizations reviewed. Please refer to Health maintenance section.  Return to care in 1 year for next preventative visit.     Subjective:  HPI:  She has no acute complaints today.   Lifestyle Diet: Balanced. Plenty of fruits and vegetables.  Exercise: Treadmill daily.    Depression screen PHQ 2/9 08/13/2020  Decreased Interest 0  Down, Depressed, Hopeless 0  PHQ - 2 Score 0    Health Maintenance Due  Topic Date Due  . TETANUS/TDAP  04/18/2019     ROS: Per HPI, otherwise a complete review of systems was negative.   PMH:  The following were reviewed and entered/updated in epic: Past Medical History:  Diagnosis Date  . Thyroid disease    Goiter   Patient Active Problem List   Diagnosis Date Noted  . Vitamin D deficiency 08/13/2020  . Dyslipidemia 08/23/2018  . Migraine headache with aura 03/30/2014  . Tachycardia 12/12/2011  . Hypothyroidism 04/14/2007  . OSTEOPOROSIS 04/14/2007   Past Surgical History:  Procedure Laterality Date  . BREAST SURGERY     right lump  . CATARACT EXTRACTION, BILATERAL     Dr Bing Plume  . thyroid    . TUBAL LIGATION      Family History  Problem Relation Age of Onset  . Colon polyps Father 14  . Arthritis Other   . Cancer Other        colon, prostate  . Heart disease Mother        CHF, died age 65  . Lung cancer Brother   . Stomach cancer Neg Hx     Medications- reviewed and updated Current Outpatient Medications  Medication Sig Dispense Refill  . cetirizine (ZYRTEC) 10 MG tablet Take 10 mg by mouth daily.    . metoprolol succinate (TOPROL-XL) 25 MG 24 hr tablet Take 1 tablet by mouth once daily 90 tablet 0  . SUMAtriptan (IMITREX) 25 MG tablet Take 1 tablet (25 mg total) by mouth every 2 (two) hours as needed for migraine. May repeat in 2 hours if headache persists or recurs. 10  tablet 0  . SYNTHROID 75 MCG tablet Take 1 tablet (75 mcg total) by mouth daily before breakfast. 90 tablet 3   No current facility-administered medications for this visit.   Allergies-reviewed and updated No Known Allergies  Social History   Socioeconomic History  . Marital status: Legally Separated    Spouse name: Not on file  . Number of children: 2  . Years of education: Not on file  . Highest education level:  Not on file  Occupational History  . Not on file  Tobacco Use  . Smoking status: Never Smoker  . Smokeless tobacco: Never Used  Substance and Sexual Activity  . Alcohol use: No  . Drug use: No  . Sexual activity: Not on file  Other Topics Concern  . Not on file  Social History Narrative   Lives with husband, son and grandson.   Social Determinants of Health   Financial Resource Strain: Not on file  Food Insecurity: Not on file  Transportation Needs: Not on file  Physical Activity: Not on file  Stress: Not on file  Social Connections: Not on file        Objective:  Physical Exam: BP 113/72   Pulse 63   Temp (!) 97.3 F (36.3 C) (Temporal)   Ht 5\' 4"  (1.626 m)   Wt 140 lb (63.5 kg)   SpO2 100%   BMI 24.03 kg/m   Body mass index is 24.03 kg/m. Wt Readings from Last 3 Encounters:  08/13/20 140 lb (63.5 kg)  03/05/20 141 lb 6.4 oz (64.1 kg)  10/20/19 144 lb (65.3 kg)   Gen: NAD, resting comfortably HEENT: TMs normal bilaterally. OP clear. No thyromegaly noted.  CV: RRR with no murmurs appreciated Pulm: NWOB, CTAB with no crackles, wheezes, or rhonchi GI: Normal bowel sounds present. Soft, Nontender, Nondistended. MSK: no edema, cyanosis, or clubbing noted Skin: warm, dry Neuro: CN2-12 grossly intact. Strength 5/5 in upper and lower extremities. Reflexes symmetric and intact bilaterally.  Psych: Normal affect and thought content     Payden Bonus M. Jerline Pain, MD 08/13/2020 11:21 AM

## 2020-08-14 ENCOUNTER — Telehealth: Payer: Self-pay

## 2020-08-14 NOTE — Telephone Encounter (Signed)
Pt is requesting a call in regards to most recent labs

## 2020-08-15 NOTE — Telephone Encounter (Signed)
See results note. 

## 2020-08-16 DIAGNOSIS — L2089 Other atopic dermatitis: Secondary | ICD-10-CM | POA: Diagnosis not present

## 2020-08-16 DIAGNOSIS — D225 Melanocytic nevi of trunk: Secondary | ICD-10-CM | POA: Diagnosis not present

## 2020-08-16 DIAGNOSIS — L821 Other seborrheic keratosis: Secondary | ICD-10-CM | POA: Diagnosis not present

## 2020-08-16 DIAGNOSIS — L814 Other melanin hyperpigmentation: Secondary | ICD-10-CM | POA: Diagnosis not present

## 2020-08-21 ENCOUNTER — Ambulatory Visit (INDEPENDENT_AMBULATORY_CARE_PROVIDER_SITE_OTHER): Payer: Medicare Other

## 2020-08-21 ENCOUNTER — Other Ambulatory Visit: Payer: Self-pay

## 2020-08-21 DIAGNOSIS — E538 Deficiency of other specified B group vitamins: Secondary | ICD-10-CM

## 2020-08-21 MED ORDER — CYANOCOBALAMIN 1000 MCG/ML IJ SOLN
1000.0000 ug | Freq: Once | INTRAMUSCULAR | Status: AC
  Administered 2020-08-21: 1000 ug via INTRAMUSCULAR

## 2020-08-21 NOTE — Progress Notes (Signed)
Per orders of Dr. Parker, injection of B-12 given by Maycol Hoying D Ferman Basilio in right deltoid. Patient tolerated injection well. Patient will make appointment for 1 week.  

## 2020-08-28 ENCOUNTER — Other Ambulatory Visit: Payer: Self-pay

## 2020-08-28 ENCOUNTER — Ambulatory Visit (INDEPENDENT_AMBULATORY_CARE_PROVIDER_SITE_OTHER): Payer: Medicare Other | Admitting: *Deleted

## 2020-08-28 DIAGNOSIS — E538 Deficiency of other specified B group vitamins: Secondary | ICD-10-CM

## 2020-08-28 MED ORDER — CYANOCOBALAMIN 1000 MCG/ML IJ SOLN
1000.0000 ug | Freq: Once | INTRAMUSCULAR | Status: AC
  Administered 2020-08-28: 1000 ug via INTRAMUSCULAR

## 2020-09-04 ENCOUNTER — Other Ambulatory Visit: Payer: Self-pay

## 2020-09-04 ENCOUNTER — Ambulatory Visit (INDEPENDENT_AMBULATORY_CARE_PROVIDER_SITE_OTHER): Payer: Medicare Other

## 2020-09-04 DIAGNOSIS — E538 Deficiency of other specified B group vitamins: Secondary | ICD-10-CM | POA: Diagnosis not present

## 2020-09-04 MED ORDER — CYANOCOBALAMIN 1000 MCG/ML IJ SOLN
1000.0000 ug | Freq: Once | INTRAMUSCULAR | Status: AC
  Administered 2020-09-04: 1000 ug via INTRAMUSCULAR

## 2020-09-04 NOTE — Progress Notes (Unsigned)
Per orders of Dr.Parker, injection of Vitamin b12 1000 mcg given left deltoid IM  by Wilmon Arms Patient tolerated injection well.  Pt knows to return in 1 week for next injection.

## 2020-09-05 DIAGNOSIS — Z961 Presence of intraocular lens: Secondary | ICD-10-CM | POA: Diagnosis not present

## 2020-09-05 DIAGNOSIS — H18593 Other hereditary corneal dystrophies, bilateral: Secondary | ICD-10-CM | POA: Diagnosis not present

## 2020-09-05 DIAGNOSIS — H18453 Nodular corneal degeneration, bilateral: Secondary | ICD-10-CM | POA: Diagnosis not present

## 2020-09-05 DIAGNOSIS — H26493 Other secondary cataract, bilateral: Secondary | ICD-10-CM | POA: Diagnosis not present

## 2020-09-11 ENCOUNTER — Other Ambulatory Visit: Payer: Self-pay

## 2020-09-11 ENCOUNTER — Ambulatory Visit (INDEPENDENT_AMBULATORY_CARE_PROVIDER_SITE_OTHER): Payer: Medicare Other

## 2020-09-11 ENCOUNTER — Telehealth: Payer: Self-pay

## 2020-09-11 DIAGNOSIS — E538 Deficiency of other specified B group vitamins: Secondary | ICD-10-CM

## 2020-09-11 MED ORDER — CYANOCOBALAMIN 1000 MCG/ML IJ SOLN
1000.0000 ug | Freq: Once | INTRAMUSCULAR | Status: AC
  Administered 2020-09-11: 1000 ug via INTRAMUSCULAR

## 2020-09-11 NOTE — Telephone Encounter (Signed)
She can schedule a lab appointment soon to check B12 and TSH. She should take 1084mcg B12 daily.  Algis Greenhouse. Jerline Pain, MD 09/11/2020 3:27 PM

## 2020-09-11 NOTE — Telephone Encounter (Signed)
Patient just received her last b12 injection. She wanted to know if you wanted her to now take an OTC B12 supplement to keep her B12 levels in the normal range.  She also states that her synthroid medication has recently been changed and would like to know when she would need to come into the office and have blood work done because her next appointment isn't until next year.

## 2020-09-12 ENCOUNTER — Other Ambulatory Visit: Payer: Self-pay | Admitting: *Deleted

## 2020-09-12 DIAGNOSIS — E039 Hypothyroidism, unspecified: Secondary | ICD-10-CM

## 2020-09-12 NOTE — Telephone Encounter (Signed)
Patient returned call  Aware has 3 more B12 Inj.(One per month for 3 month) left. Returned I 4 month for TSH retest

## 2020-09-12 NOTE — Telephone Encounter (Signed)
Left message to return call to our office at their convenience.   After taking B12 inj once per month for 3 month patient patient can do blood work

## 2020-09-20 NOTE — Progress Notes (Signed)
Per orders of Dr. Jerline Pain, injection of B-12 given by [facility] in right deltoid. Patient tolerated injection well. Patient will make appointment for 1 week.

## 2020-10-04 ENCOUNTER — Telehealth: Payer: Self-pay | Admitting: Family Medicine

## 2020-10-04 NOTE — Telephone Encounter (Signed)
Left message for patient to call back and schedule Medicare Annual Wellness Visit (AWV) either virtually or in office. No detailed message left   Last AWV 02/28/2019 please schedule at anytime   This should be a 45 minute visit.

## 2020-10-08 NOTE — Telephone Encounter (Signed)
Patient does not want to schedule AWV

## 2020-10-11 ENCOUNTER — Ambulatory Visit (INDEPENDENT_AMBULATORY_CARE_PROVIDER_SITE_OTHER): Payer: Medicare Other | Admitting: *Deleted

## 2020-10-11 ENCOUNTER — Other Ambulatory Visit: Payer: Self-pay

## 2020-10-11 DIAGNOSIS — E538 Deficiency of other specified B group vitamins: Secondary | ICD-10-CM | POA: Diagnosis not present

## 2020-10-11 MED ORDER — CYANOCOBALAMIN 1000 MCG/ML IJ SOLN
1000.0000 ug | Freq: Once | INTRAMUSCULAR | Status: AC
  Administered 2020-10-11: 1000 ug via INTRAMUSCULAR

## 2020-10-11 NOTE — Progress Notes (Signed)
Patient presents for B12 injection today. Patient received her B12 injection in Left  Deltoid. Patient tolerated injection well.  Documentation entered in MAR in [employer].   

## 2020-10-15 ENCOUNTER — Other Ambulatory Visit: Payer: Self-pay | Admitting: Family Medicine

## 2020-11-08 ENCOUNTER — Telehealth: Payer: Self-pay

## 2020-11-08 ENCOUNTER — Other Ambulatory Visit: Payer: Self-pay

## 2020-11-08 ENCOUNTER — Ambulatory Visit (INDEPENDENT_AMBULATORY_CARE_PROVIDER_SITE_OTHER): Payer: Medicare Other

## 2020-11-08 DIAGNOSIS — E538 Deficiency of other specified B group vitamins: Secondary | ICD-10-CM

## 2020-11-08 MED ORDER — CYANOCOBALAMIN 1000 MCG/ML IJ SOLN
1000.0000 ug | Freq: Once | INTRAMUSCULAR | Status: AC
  Administered 2020-11-08: 1000 ug via INTRAMUSCULAR

## 2020-11-08 NOTE — Progress Notes (Signed)
Natalie [name] 75 year old female presents in office today for b12 injection per Dimas Chyle, MD. Administered CYANOCOBALAMIN 1,024mcg IM right deltoid. Patient tolerated injection well, is aware to get levels checked.

## 2020-11-08 NOTE — Telephone Encounter (Signed)
Pt received her last B12 shot today. Pt asked if she needed to get lab work done to check her levels. There are no orders for B12 in labs. Please advise.

## 2020-11-09 ENCOUNTER — Other Ambulatory Visit: Payer: Self-pay | Admitting: *Deleted

## 2020-11-09 DIAGNOSIS — E538 Deficiency of other specified B group vitamins: Secondary | ICD-10-CM

## 2020-11-09 NOTE — Telephone Encounter (Signed)
LVM  lab placed in, call for appointment

## 2020-11-14 ENCOUNTER — Other Ambulatory Visit: Payer: Self-pay

## 2020-11-14 ENCOUNTER — Other Ambulatory Visit (INDEPENDENT_AMBULATORY_CARE_PROVIDER_SITE_OTHER): Payer: Medicare Other

## 2020-11-14 DIAGNOSIS — E039 Hypothyroidism, unspecified: Secondary | ICD-10-CM | POA: Diagnosis not present

## 2020-11-14 DIAGNOSIS — E538 Deficiency of other specified B group vitamins: Secondary | ICD-10-CM

## 2020-11-15 LAB — TSH: TSH: 0.54 u[IU]/mL (ref 0.35–4.50)

## 2020-11-15 LAB — VITAMIN B12: Vitamin B-12: 611 pg/mL (ref 211–911)

## 2020-11-15 NOTE — Progress Notes (Signed)
Please inform patient of the following:  Her B12 level is back to normal.  She can continue with monthly injections or she can switch to oral supplementation 1000 mcg daily.  Either way we should recheck in about 6 months.

## 2021-01-08 ENCOUNTER — Other Ambulatory Visit: Payer: Self-pay | Admitting: Family Medicine

## 2021-01-10 DIAGNOSIS — L820 Inflamed seborrheic keratosis: Secondary | ICD-10-CM | POA: Diagnosis not present

## 2021-01-22 DIAGNOSIS — H18593 Other hereditary corneal dystrophies, bilateral: Secondary | ICD-10-CM | POA: Diagnosis not present

## 2021-01-22 DIAGNOSIS — H43813 Vitreous degeneration, bilateral: Secondary | ICD-10-CM | POA: Diagnosis not present

## 2021-01-22 DIAGNOSIS — H04123 Dry eye syndrome of bilateral lacrimal glands: Secondary | ICD-10-CM | POA: Diagnosis not present

## 2021-01-22 DIAGNOSIS — H40013 Open angle with borderline findings, low risk, bilateral: Secondary | ICD-10-CM | POA: Diagnosis not present

## 2021-04-02 ENCOUNTER — Other Ambulatory Visit: Payer: Self-pay

## 2021-04-02 ENCOUNTER — Ambulatory Visit (INDEPENDENT_AMBULATORY_CARE_PROVIDER_SITE_OTHER): Payer: Medicare Other

## 2021-04-02 ENCOUNTER — Encounter: Payer: Self-pay | Admitting: Family Medicine

## 2021-04-02 DIAGNOSIS — Z23 Encounter for immunization: Secondary | ICD-10-CM | POA: Diagnosis not present

## 2021-04-08 ENCOUNTER — Other Ambulatory Visit: Payer: Self-pay | Admitting: Family Medicine

## 2021-04-18 ENCOUNTER — Ambulatory Visit (INDEPENDENT_AMBULATORY_CARE_PROVIDER_SITE_OTHER): Payer: Medicare Other | Admitting: Family Medicine

## 2021-04-18 ENCOUNTER — Other Ambulatory Visit: Payer: Self-pay

## 2021-04-18 ENCOUNTER — Encounter: Payer: Self-pay | Admitting: Family Medicine

## 2021-04-18 VITALS — BP 137/70 | HR 73 | Temp 98.0°F | Ht 64.0 in | Wt 142.6 lb

## 2021-04-18 DIAGNOSIS — R000 Tachycardia, unspecified: Secondary | ICD-10-CM

## 2021-04-18 DIAGNOSIS — I8311 Varicose veins of right lower extremity with inflammation: Secondary | ICD-10-CM

## 2021-04-18 DIAGNOSIS — E538 Deficiency of other specified B group vitamins: Secondary | ICD-10-CM

## 2021-04-18 DIAGNOSIS — G43101 Migraine with aura, not intractable, with status migrainosus: Secondary | ICD-10-CM

## 2021-04-18 DIAGNOSIS — E559 Vitamin D deficiency, unspecified: Secondary | ICD-10-CM

## 2021-04-18 DIAGNOSIS — R5383 Other fatigue: Secondary | ICD-10-CM

## 2021-04-18 DIAGNOSIS — I8312 Varicose veins of left lower extremity with inflammation: Secondary | ICD-10-CM

## 2021-04-18 DIAGNOSIS — E039 Hypothyroidism, unspecified: Secondary | ICD-10-CM

## 2021-04-18 LAB — VITAMIN D 25 HYDROXY (VIT D DEFICIENCY, FRACTURES): VITD: 32.58 ng/mL (ref 30.00–100.00)

## 2021-04-18 LAB — CBC
HCT: 37.9 % (ref 36.0–46.0)
Hemoglobin: 12.6 g/dL (ref 12.0–15.0)
MCHC: 33.3 g/dL (ref 30.0–36.0)
MCV: 90.4 fl (ref 78.0–100.0)
Platelets: 269 10*3/uL (ref 150.0–400.0)
RBC: 4.19 Mil/uL (ref 3.87–5.11)
RDW: 13.2 % (ref 11.5–15.5)
WBC: 6.3 10*3/uL (ref 4.0–10.5)

## 2021-04-18 LAB — COMPREHENSIVE METABOLIC PANEL
ALT: 18 U/L (ref 0–35)
AST: 18 U/L (ref 0–37)
Albumin: 4 g/dL (ref 3.5–5.2)
Alkaline Phosphatase: 89 U/L (ref 39–117)
BUN: 15 mg/dL (ref 6–23)
CO2: 25 mEq/L (ref 19–32)
Calcium: 9 mg/dL (ref 8.4–10.5)
Chloride: 104 mEq/L (ref 96–112)
Creatinine, Ser: 0.76 mg/dL (ref 0.40–1.20)
GFR: 77.65 mL/min (ref >60.00)
Glucose, Bld: 97 mg/dL (ref 70–99)
Potassium: 4 mEq/L (ref 3.5–5.1)
Sodium: 138 mEq/L (ref 135–145)
Total Bilirubin: 0.4 mg/dL (ref 0.2–1.2)
Total Protein: 7.4 g/dL (ref 6.0–8.3)

## 2021-04-18 LAB — TSH: TSH: 0.75 u[IU]/mL (ref 0.35–5.50)

## 2021-04-18 LAB — T3, FREE: T3, Free: 3.1 pg/mL (ref 2.3–4.2)

## 2021-04-18 LAB — VITAMIN B12: Vitamin B-12: 654 pg/mL (ref 211–911)

## 2021-04-18 NOTE — Progress Notes (Signed)
Natalie [name] is a 75 y.o. female who presents today for an office visit.  Assessment/Plan:  New/Acute Problems: Left Leg Pain Likely due to painful varicosities.  Hamstring strain also possible though less likely given that her pain is not reproducible with resisted knee flexion.  She will continue with NSAIDs and ice.  She will let me know if not proving over the next couple weeks.  Other fatigue We will check labs including CBC, c-Met, TSH, T3, B12, and vitamin D.  Potentially could be due to metoprolol.  If labs are normal we will consider decreasing dose.  Her sleep is restful-doubt this is contributing.  Chronic Problems Addressed Today: Migraine headache with aura Stable on Imitrex as needed.  Hypothyroidism Check TSH.  Potentially could be contributing some to her fatigue.  We will check T3 as well.  If she has low T3 levels would consider adding on T3 replacement in addition to her Synthroid.  Tachycardia Rate controlled today.  She is on Toprol succinate 25 mg daily.  This could also be contributing some to her fatigue.  Pending the results of above labs may consider decreasing or discontinuing.  Varicose veins of both lower extremities with inflammation Likely main source of her leg pain.  Reports of cardiovascular surgery.  She will continue conservative management until she sees them.  Vitamin B12 deficiency Check B12.  Vitamin D deficiency Check vitamin D.     Subjective:  HPI:  She complains of pain behind her left knee. She notes it started 2 weeks ago. She has tried ibuprofen and ice with modest improvement. She reports she had difficulty time sleeping at night. Worse with walking. She had no recent falls. No obvious injuries or precipitating events. She denies any bruises around the area. She also denies swelling or pain in the leg. She would like to be referred to vascular doctor.   In addition to this, she complain of fatigue. She is sleeping well.  She would like get blood work done today. She is also concerned about her weight gain. She thinks it might be due to low dose of Metoprolol.        Objective:  Physical Exam: BP 137/70   Pulse 73   Temp 98 F (36.7 C) (Temporal)   Ht 5' 4" (1.626 m)   Wt 142 lb 9.6 oz (64.7 kg)   SpO2 99%   BMI 24.48 kg/m   Gen: No acute distress, resting comfortably CV: Regular rate and rhythm with no murmurs appreciated Pulm: Normal work of breathing, clear to auscultation bilaterally with no crackles, wheezes, or rhonchi MSK: Several varicosities noted on lower extremities.  Left lateral distal hamstring tendon tender to palpation. Overlying area of painful varicosity of this area as well.  No pain elicited with resisted knee flexion. Neuro: Grossly normal, moves all extremities Psych: Normal affect and thought content       I,Savera Zaman,acting as a scribe for Caleb Parker, MD.,have documented all relevant documentation on the behalf of Caleb Parker, MD,as directed by  Caleb Parker, MD while in the presence of Caleb Parker, MD.   I, Caleb Parker, MD, have reviewed all documentation for this visit. The documentation on 04/18/21 for the exam, diagnosis, procedures, and orders are all accurate and complete.  Caleb M. Parker, MD 04/18/2021 9:36 AM   

## 2021-04-18 NOTE — Assessment & Plan Note (Signed)
Check B12 

## 2021-04-18 NOTE — Patient Instructions (Addendum)
It was very nice to see you today!  Will place referral for you to see a vascular doctor.   I think your varicose veins are causing the pain that you are having.  Please continue with the ice and over-the-counter meds.  You  may have a mild hamstring strain as well.  This should also improve with ice and  Anti-inflammatories.  Please use compression stockings.  We will check blood work today to look for any possible causes of your fatigue.  We may need to decrease your dose of metoprolol depending on the results.  Take care, Dr Jerline Pain  PLEASE NOTE:  If you had any lab tests please let us know if you have not heard back within a few days. You may see your results on mychart before we have a chance to review them but we will give you a call once they are reviewed by [place]. If we ordered any referrals today, please let us know if you have not heard from their office within the next week.   Please try these tips to maintain a healthy lifestyle:  Eat at least 3 REAL meals and 1-2 snacks per day.  Aim for no more than 5 hours between eating.  If you eat breakfast, please do so within one hour of getting up.   Each meal should contain half fruits/vegetables, one quarter protein, and one quarter carbs (no bigger than a computer mouse)  Cut down on sweet beverages. This includes juice, soda, and sweet tea.   Drink at least 1 glass of water with each meal and aim for at least 8 glasses per day  Exercise at least 150 minutes every week.

## 2021-04-18 NOTE — Assessment & Plan Note (Signed)
Check TSH.  Potentially could be contributing some to her fatigue.  We will check T3 as well.  If she has low T3 levels would consider adding on T3 replacement in addition to her Synthroid.

## 2021-04-18 NOTE — Assessment & Plan Note (Signed)
Rate controlled today.  She is on Toprol succinate 25 mg daily.  This could also be contributing some to her fatigue.  Pending the results of above labs may consider decreasing or discontinuing.

## 2021-04-18 NOTE — Assessment & Plan Note (Signed)
Stable on Imitrex as needed.

## 2021-04-18 NOTE — Assessment & Plan Note (Signed)
Check vitamin D. 

## 2021-04-18 NOTE — Assessment & Plan Note (Signed)
Likely main source of her leg pain.  Reports of cardiovascular surgery.  She will continue conservative management until she sees them.

## 2021-04-19 ENCOUNTER — Encounter: Payer: Self-pay | Admitting: Family Medicine

## 2021-04-19 NOTE — Progress Notes (Signed)
Please inform patient of the following:  Labs are all stable. No clear reason for her fatigue. We can decrease her metoprolol to see if this helps with her energy levels. Can cut in half to 12.5mg  once daily if she wishes to try this. Would like for her to check back in with [place] in a few weeks.

## 2021-04-30 ENCOUNTER — Other Ambulatory Visit: Payer: Self-pay

## 2021-04-30 DIAGNOSIS — I8311 Varicose veins of right lower extremity with inflammation: Secondary | ICD-10-CM

## 2021-04-30 DIAGNOSIS — I8312 Varicose veins of left lower extremity with inflammation: Secondary | ICD-10-CM

## 2021-05-07 ENCOUNTER — Encounter (HOSPITAL_COMMUNITY): Payer: Medicare Other

## 2021-05-22 ENCOUNTER — Encounter (HOSPITAL_COMMUNITY): Payer: Medicare Other

## 2021-06-14 ENCOUNTER — Encounter (HOSPITAL_COMMUNITY): Payer: Medicare Other

## 2021-06-26 ENCOUNTER — Other Ambulatory Visit: Payer: Self-pay | Admitting: Family Medicine

## 2021-07-03 DIAGNOSIS — L820 Inflamed seborrheic keratosis: Secondary | ICD-10-CM | POA: Diagnosis not present

## 2021-07-23 DIAGNOSIS — R600 Localized edema: Secondary | ICD-10-CM | POA: Diagnosis not present

## 2021-07-23 DIAGNOSIS — Z23 Encounter for immunization: Secondary | ICD-10-CM | POA: Diagnosis not present

## 2021-07-23 DIAGNOSIS — L820 Inflamed seborrheic keratosis: Secondary | ICD-10-CM | POA: Diagnosis not present

## 2021-07-27 ENCOUNTER — Other Ambulatory Visit: Payer: Self-pay | Admitting: Family Medicine

## 2021-07-30 ENCOUNTER — Telehealth: Payer: Self-pay | Admitting: Family Medicine

## 2021-07-30 NOTE — Telephone Encounter (Signed)
Copied from [place] [phone]. Topic: Medicare AWV >> Jul 30, 2021 11:10 AM Harris-Coley, Hannah Beat wrote: Reason for CRM: Left message for patient to schedule Annual Wellness Visit.  Please schedule with Nurse Health Advisor Charlott Rakes, RN at [facility].  Please call [phone] ask for [facility]

## 2021-08-14 ENCOUNTER — Encounter (HOSPITAL_COMMUNITY): Payer: Medicare Other

## 2021-08-15 ENCOUNTER — Encounter: Payer: Self-pay | Admitting: Family Medicine

## 2021-08-15 ENCOUNTER — Ambulatory Visit (INDEPENDENT_AMBULATORY_CARE_PROVIDER_SITE_OTHER): Payer: Medicare Other | Admitting: Family Medicine

## 2021-08-15 ENCOUNTER — Other Ambulatory Visit: Payer: Self-pay

## 2021-08-15 VITALS — BP 111/74 | HR 80 | Temp 97.3°F | Ht 64.0 in | Wt 139.8 lb

## 2021-08-15 DIAGNOSIS — R739 Hyperglycemia, unspecified: Secondary | ICD-10-CM | POA: Diagnosis not present

## 2021-08-15 DIAGNOSIS — Z0001 Encounter for general adult medical examination with abnormal findings: Secondary | ICD-10-CM

## 2021-08-15 DIAGNOSIS — E2839 Other primary ovarian failure: Secondary | ICD-10-CM

## 2021-08-15 DIAGNOSIS — Z1211 Encounter for screening for malignant neoplasm of colon: Secondary | ICD-10-CM

## 2021-08-15 DIAGNOSIS — E559 Vitamin D deficiency, unspecified: Secondary | ICD-10-CM | POA: Diagnosis not present

## 2021-08-15 DIAGNOSIS — G43101 Migraine with aura, not intractable, with status migrainosus: Secondary | ICD-10-CM | POA: Diagnosis not present

## 2021-08-15 DIAGNOSIS — E785 Hyperlipidemia, unspecified: Secondary | ICD-10-CM

## 2021-08-15 DIAGNOSIS — E039 Hypothyroidism, unspecified: Secondary | ICD-10-CM

## 2021-08-15 DIAGNOSIS — E538 Deficiency of other specified B group vitamins: Secondary | ICD-10-CM

## 2021-08-15 DIAGNOSIS — M810 Age-related osteoporosis without current pathological fracture: Secondary | ICD-10-CM

## 2021-08-15 LAB — COMPREHENSIVE METABOLIC PANEL
ALT: 17 U/L (ref 0–35)
AST: 17 U/L (ref 0–37)
Albumin: 4.2 g/dL (ref 3.5–5.2)
Alkaline Phosphatase: 97 U/L (ref 39–117)
BUN: 13 mg/dL (ref 6–23)
CO2: 27 mEq/L (ref 19–32)
Calcium: 9.6 mg/dL (ref 8.4–10.5)
Chloride: 102 mEq/L (ref 96–112)
Creatinine, Ser: 0.75 mg/dL (ref 0.40–1.20)
GFR: 78.71 mL/min (ref >60.00)
Glucose, Bld: 88 mg/dL (ref 70–99)
Potassium: 4.4 mEq/L (ref 3.5–5.1)
Sodium: 138 mEq/L (ref 135–145)
Total Bilirubin: 0.6 mg/dL (ref 0.2–1.2)
Total Protein: 7.6 g/dL (ref 6.0–8.3)

## 2021-08-15 LAB — CBC
HCT: 39.6 % (ref 36.0–46.0)
Hemoglobin: 13.2 g/dL (ref 12.0–15.0)
MCHC: 33.4 g/dL (ref 30.0–36.0)
MCV: 89.3 fl (ref 78.0–100.0)
Platelets: 261 10*3/uL (ref 150.0–400.0)
RBC: 4.43 Mil/uL (ref 3.87–5.11)
RDW: 13 % (ref 11.5–15.5)
WBC: 7.9 10*3/uL (ref 4.0–10.5)

## 2021-08-15 LAB — VITAMIN B12: Vitamin B-12: 1066 pg/mL — ABNORMAL HIGH (ref 211–911)

## 2021-08-15 LAB — LIPID PANEL
Cholesterol: 218 mg/dL — ABNORMAL HIGH (ref 0–200)
HDL: 47.7 mg/dL (ref >39.00)
LDL Cholesterol: 139 mg/dL — ABNORMAL HIGH (ref 0–99)
NonHDL: 170.79
Total CHOL/HDL Ratio: 5
Triglycerides: 160 mg/dL — ABNORMAL HIGH (ref 0.0–149.0)
VLDL: 32 mg/dL (ref 0.0–40.0)

## 2021-08-15 LAB — HEMOGLOBIN A1C: Hgb A1c MFr Bld: 5.9 % (ref 4.6–6.5)

## 2021-08-15 LAB — VITAMIN D 25 HYDROXY (VIT D DEFICIENCY, FRACTURES): VITD: 31.89 ng/mL (ref 30.00–100.00)

## 2021-08-15 LAB — TSH: TSH: 0.31 u[IU]/mL — ABNORMAL LOW (ref 0.35–5.50)

## 2021-08-15 NOTE — Assessment & Plan Note (Signed)
Check DEXA 

## 2021-08-15 NOTE — Progress Notes (Signed)
Chief Complaint:  Natalie [name] is a 76 y.o. female who presents today for her annual comprehensive physical exam.    Assessment/Plan:  Chronic Problems Addressed Today: Migraine headache with aura Doing well.  No recent flares.  She continues Imitrex as needed.  We will continue metoprolol succinate 25 mg once daily.  Hypothyroidism Continue Synthroid 75 mcg daily.  We will check TSH today.  Hyperglycemia Check A1c.  Vitamin B12 deficiency Check B12.  Continue supplementation.  Vitamin D deficiency Check vitamin D.  Continue supplementation.  Dyslipidemia Check lipds.   Osteoporosis Check DEXA.    Preventative Healthcare: Will get blood work done today. Gets mammogram and DEXA through gynecologist. Discussed shingles vaccine. Due for colonoscopy. UTD on other vaccines and screenings.   Patient Counseling(The following topics were reviewed and/or handout was given):  -Nutrition: Stressed importance of moderation in sodium/caffeine intake, saturated fat and cholesterol, caloric balance, sufficient intake of fresh fruits, vegetables, and fiber.  -Stressed the importance of regular exercise.   -Substance Abuse: Discussed cessation/primary prevention of tobacco, alcohol, or other drug use; driving or other dangerous activities under the influence; availability of treatment for abuse.   -Injury prevention: Discussed safety belts, safety helmets, smoke detector, smoking near bedding or upholstery.   -Sexuality: Discussed sexually transmitted diseases, partner selection, use of condoms, avoidance of unintended pregnancy and contraceptive alternatives.   -Dental health: Discussed importance of regular tooth brushing, flossing, and dental visits.  -Health maintenance and immunizations reviewed. Please refer to Health maintenance section.  Return to care in 1 year for next preventative visit.     Subjective:  HPI:  She has no acute complaints today.   She has had issue  with fatigue in the last visit, We started her on the lower dose of Metoprolol. She notes she still have some fatigue. Sh notes metoprolol has helped headaches. She has not had any migraine episodes. She is tolerating her medication. No side effects.   Lifestyle Diet: Balanced: Plenty of vegetables. Exercise: Likes to dance.  Depression screen PHQ 2/9 08/15/2021  Decreased Interest 0  Down, Depressed, Hopeless 0  PHQ - 2 Score 0    Health Maintenance Due  Topic Date Due   Zoster Vaccines- Shingrix (1 of 2) Never done   TETANUS/TDAP  04/18/2019   COVID-19 Vaccine (4 - Booster for Pfizer series) 06/30/2020     ROS: Per HPI, otherwise a complete review of systems was negative.   PMH:  The following were reviewed and entered/updated in epic: Past Medical History:  Diagnosis Date   Thyroid disease    Goiter   Patient Active Problem List   Diagnosis Date Noted   Varicose veins of both lower extremities with inflammation 04/18/2021   Vitamin D deficiency 08/13/2020   Vitamin B12 deficiency 08/13/2020   Hyperglycemia 08/13/2020   Dyslipidemia 08/23/2018   Migraine headache with aura 03/30/2014   Tachycardia 12/12/2011   Hypothyroidism 04/14/2007   Osteoporosis 04/14/2007   Past Surgical History:  Procedure Laterality Date   BREAST SURGERY     right lump   CATARACT EXTRACTION, BILATERAL     Dr Bing Plume   thyroid     TUBAL LIGATION      Family History  Problem Relation Age of Onset   Colon polyps Father 17   Arthritis Other    Cancer Other        colon, prostate   Heart disease Mother        CHF, died age 37  Lung cancer Brother    Stomach cancer Neg Hx     Medications- reviewed and updated Current Outpatient Medications  Medication Sig Dispense Refill   cetirizine (ZYRTEC) 10 MG tablet Take 10 mg by mouth daily.     metoprolol succinate (TOPROL-XL) 25 MG 24 hr tablet Take 1 tablet by mouth once daily 90 tablet 0   SUMAtriptan (IMITREX) 25 MG tablet Take 1 tablet  (25 mg total) by mouth every 2 (two) hours as needed for migraine. May repeat in 2 hours if headache persists or recurs. 10 tablet 0   SYNTHROID 75 MCG tablet TAKE 1 TABLET BY MOUTH ONCE DAILY BEFORE BREAKFAST 90 tablet 0   No current facility-administered medications for this visit.    Allergies-reviewed and updated No Known Allergies  Social History   Socioeconomic History   Marital status: Legally Separated    Spouse name: Not on file   Number of children: 2   Years of education: Not on file   Highest education level: Not on file  Occupational History   Not on file  Tobacco Use   Smoking status: Never   Smokeless tobacco: Never  Substance and Sexual Activity   Alcohol use: No   Drug use: No   Sexual activity: Not on file  Other Topics Concern   Not on file  Social History Narrative   Lives with husband, son and grandson.   Social Determinants of Health   Financial Resource Strain: Not on file  Food Insecurity: Not on file  Transportation Needs: Not on file  Physical Activity: Not on file  Stress: Not on file  Social Connections: Not on file        Objective:  Physical Exam: BP 111/74 (BP Location: Left Arm)    Pulse 80    Temp (!) 97.3 F (36.3 C) (Temporal)    Ht 5\' 4"  (1.626 m)    Wt 139 lb 12.8 oz (63.4 kg)    SpO2 98%    BMI 24.00 kg/m   Body mass index is 24 kg/m. Wt Readings from Last 3 Encounters:  08/15/21 139 lb 12.8 oz (63.4 kg)  04/18/21 142 lb 9.6 oz (64.7 kg)  08/13/20 140 lb (63.5 kg)   Gen: NAD, resting comfortably HEENT: TMs normal bilaterally. OP clear. No thyromegaly noted.  CV: RRR with no murmurs appreciated Pulm: NWOB, CTAB with no crackles, wheezes, or rhonchi GI: Normal bowel sounds present. Soft, Nontender, Nondistended. MSK: no edema, cyanosis, or clubbing noted Skin: warm, dry Neuro: CN2-12 grossly intact. Strength 5/5 in upper and lower extremities. Reflexes symmetric and intact bilaterally.  Psych: Normal affect and thought  content      I,Savera Zaman,acting as a scribe for Dimas Chyle, MD.,have documented all relevant documentation on the behalf of Dimas Chyle, MD,as directed by  Dimas Chyle, MD while in the presence of Dimas Chyle, MD.   I, Dimas Chyle, MD, have reviewed all documentation for this visit. The documentation on 08/15/21 for the exam, diagnosis, procedures, and orders are all accurate and complete.  Algis Greenhouse. Jerline Pain, MD 08/15/2021 1:56 PM

## 2021-08-15 NOTE — Assessment & Plan Note (Signed)
Continue Synthroid 75 mcg daily.  We will check TSH today.

## 2021-08-15 NOTE — Patient Instructions (Signed)
It was very nice to see you today!  We will check blood work today.  I will order a bone density scan.  You can get this done at the same time as your mammogram.  Please call your OB/GYN to schedule appointment soon.  I will refer you for your colonoscopy.  Please keep working on diet and exercise.  We will see you back in a year for your next physical.  Come back sooner if needed.  Take care, Dr Jerline Pain  PLEASE NOTE:  If you had any lab tests please let us know if you have not heard back within a few days. You may see your results on mychart before we have a chance to review them but we will give you a call once they are reviewed by [place]. If we ordered any referrals today, please let us know if you have not heard from their office within the next week.   Please try these tips to maintain a healthy lifestyle:  Eat at least 3 REAL meals and 1-2 snacks per day.  Aim for no more than 5 hours between eating.  If you eat breakfast, please do so within one hour of getting up.   Each meal should contain half fruits/vegetables, one quarter protein, and one quarter carbs (no bigger than a computer mouse)  Cut down on sweet beverages. This includes juice, soda, and sweet tea.   Drink at least 1 glass of water with each meal and aim for at least 8 glasses per day  Exercise at least 150 minutes every week.    Preventive Care 45 Years and Older, Female Preventive care refers to lifestyle choices and visits with your health care provider that can promote health and wellness. Preventive care visits are also called wellness exams. What can I expect for my preventive care visit? Counseling Your health care provider may ask you questions about your: Medical history, including: Past medical problems. Family medical history. Pregnancy and menstrual history. History of falls. Current health, including: Memory and ability to understand (cognition). Emotional well-being. Home life and relationship  well-being. Sexual activity and sexual health. Lifestyle, including: Alcohol, nicotine or tobacco, and drug use. Access to firearms. Diet, exercise, and sleep habits. Work and work Statistician. Sunscreen use. Safety issues such as seatbelt and bike helmet use. Physical exam Your health care provider will check your: Height and weight. These may be used to calculate your BMI (body mass index). BMI is a measurement that tells if you are at a healthy weight. Waist circumference. This measures the distance around your waistline. This measurement also tells if you are at a healthy weight and may help predict your risk of certain diseases, such as type 2 diabetes and high blood pressure. Heart rate and blood pressure. Body temperature. Skin for abnormal spots. What immunizations do I need? Vaccines are usually given at various ages, according to a schedule. Your health care provider will recommend vaccines for you based on your age, medical history, and lifestyle or other factors, such as travel or where you work. What tests do I need? Screening Your health care provider may recommend screening tests for certain conditions. This may include: Lipid and cholesterol levels. Hepatitis C test. Hepatitis B test. HIV (human immunodeficiency virus) test. STI (sexually transmitted infection) testing, if you are at risk. Lung cancer screening. Colorectal cancer screening. Diabetes screening. This is done by checking your blood sugar (glucose) after you have not eaten for a while (fasting). Mammogram. Talk with your health  care provider about how often you should have regular mammograms. BRCA-related cancer screening. This may be done if you have a family history of breast, ovarian, tubal, or peritoneal cancers. Bone density scan. This is done to screen for osteoporosis. Talk with your health care provider about your test results, treatment options, and if necessary, the need for more tests. Follow  these instructions at home: Eating and drinking  Eat a diet that includes fresh fruits and vegetables, whole grains, lean protein, and low-fat dairy products. Limit your intake of foods with high amounts of sugar, saturated fats, and salt. Take vitamin and mineral supplements as recommended by your health care provider. Do not drink alcohol if your health care provider tells you not to drink. If you drink alcohol: Limit how much you have to 0-1 drink a day. Know how much alcohol is in your drink. In the [place] one drink equals one 12 oz bottle of beer (355 mL), one 5 oz glass of wine (148 mL), or one 1 oz glass of hard liquor (44 mL). Lifestyle Brush your teeth every morning and night with fluoride toothpaste. Floss one time each day. Exercise for at least 30 minutes 5 or more days each week. Do not use any products that contain nicotine or tobacco. These products include cigarettes, chewing tobacco, and vaping devices, such as e-cigarettes. If you need help quitting, ask your health care provider. Do not use drugs. If you are sexually active, practice safe sex. Use a condom or other form of protection in order to prevent STIs. Take aspirin only as told by your health care provider. Make sure that you understand how much to take and what form to take. Work with your health care provider to find out whether it is safe and beneficial for you to take aspirin daily. Ask your health care provider if you need to take a cholesterol-lowering medicine (statin). Find healthy ways to manage stress, such as: Meditation, yoga, or listening to music. Journaling. Talking to a trusted person. Spending time with friends and family. Minimize exposure to UV radiation to reduce your risk of skin cancer. Safety Always wear your seat belt while driving or riding in a vehicle. Do not drive: If you have been drinking alcohol. Do not ride with someone who has been drinking. When you are tired or distracted. While  texting. If you have been using any mind-altering substances or drugs. Wear a helmet and other protective equipment during sports activities. If you have firearms in your house, make sure you follow all gun safety procedures. What's next? Visit your health care provider once a year for an annual wellness visit. Ask your health care provider how often you should have your eyes and teeth checked. Stay up to date on all vaccines. This information is not intended to replace advice given to you by your health care provider. Make sure you discuss any questions you have with your health care provider. Document Revised: 12/26/2020 Document Reviewed: 12/26/2020 Elsevier Patient Education  [place].

## 2021-08-15 NOTE — Assessment & Plan Note (Signed)
Check lipds.

## 2021-08-15 NOTE — Assessment & Plan Note (Signed)
Check vitamin D.  Continue supplementation.

## 2021-08-15 NOTE — Assessment & Plan Note (Signed)
Doing well.  No recent flares.  She continues Imitrex as needed.  We will continue metoprolol succinate 25 mg once daily.

## 2021-08-15 NOTE — Assessment & Plan Note (Signed)
Check A1c. 

## 2021-08-15 NOTE — Assessment & Plan Note (Signed)
Check B12.  Continue supplementation.

## 2021-08-19 ENCOUNTER — Telehealth: Payer: Self-pay

## 2021-08-19 NOTE — Telephone Encounter (Signed)
Patient called about her lab results. I let patient know that Dr. Jerline Pain had not commented on them that someone would reach back out to her once they had been.

## 2021-08-20 NOTE — Progress Notes (Signed)
Please inform patient of the following:  Cholesterol and blood sugar are both borderline but stable. Do not need to make any medication change but she should continue to work on diet and exercise and we can recheck in a year.  Her thyroid is off a little. Can we have her come back in a few weeks to recheck before making any changes?  The rest of her blood work is normal and we can repeat in a year.  Natalie [name]. Jerline Pain, MD 08/20/2021 4:34 PM

## 2021-08-21 ENCOUNTER — Other Ambulatory Visit: Payer: Self-pay | Admitting: *Deleted

## 2021-08-21 DIAGNOSIS — E039 Hypothyroidism, unspecified: Secondary | ICD-10-CM

## 2021-08-21 NOTE — Telephone Encounter (Signed)
See results note. 

## 2021-08-22 ENCOUNTER — Other Ambulatory Visit: Payer: Self-pay

## 2021-08-22 ENCOUNTER — Telehealth: Payer: Self-pay | Admitting: Family Medicine

## 2021-08-22 NOTE — Telephone Encounter (Signed)
Pt was returning a call to [place], she believes.

## 2021-08-22 NOTE — Progress Notes (Signed)
Ok to order TSH, free T4 and free t3.  Algis Greenhouse. Jerline Pain, MD 08/22/2021 7:51 AM

## 2021-08-22 NOTE — Telephone Encounter (Signed)
Spoke with pt and lab appt was already scheduled and orders placed previously

## 2021-09-04 ENCOUNTER — Other Ambulatory Visit (INDEPENDENT_AMBULATORY_CARE_PROVIDER_SITE_OTHER): Payer: Medicare Other

## 2021-09-04 ENCOUNTER — Other Ambulatory Visit: Payer: Self-pay

## 2021-09-04 DIAGNOSIS — E039 Hypothyroidism, unspecified: Secondary | ICD-10-CM | POA: Diagnosis not present

## 2021-09-04 LAB — TSH: TSH: 1.2 u[IU]/mL (ref 0.35–5.50)

## 2021-09-05 NOTE — Progress Notes (Signed)
Please inform patient of the following:  Thyroid level is normal. Do not need to make any changes to her treatment plan. We can recheck at her next office visit.  Natalie [name]. Jerline Pain, MD 09/05/2021 3:17 PM

## 2021-09-12 ENCOUNTER — Ambulatory Visit (INDEPENDENT_AMBULATORY_CARE_PROVIDER_SITE_OTHER): Payer: Medicare Other

## 2021-09-12 ENCOUNTER — Other Ambulatory Visit: Payer: Self-pay

## 2021-09-12 VITALS — BP 112/68 | HR 66 | Temp 97.0°F | Wt 140.8 lb

## 2021-09-12 DIAGNOSIS — Z0000 Encounter for general adult medical examination without abnormal findings: Secondary | ICD-10-CM | POA: Diagnosis not present

## 2021-09-12 DIAGNOSIS — Z1231 Encounter for screening mammogram for malignant neoplasm of breast: Secondary | ICD-10-CM

## 2021-09-12 NOTE — Progress Notes (Signed)
Subjective:   Natalie [name] is a 76 y.o. female who presents for Medicare Annual (Subsequent) preventive examination.  Review of Systems     Cardiac Risk Factors include: advanced age (>65men, >75 women);dyslipidemia     Objective:    Today's Vitals   09/12/21 1306  BP: 112/68  Pulse: 66  Temp: (!) 97 F (36.1 C)  SpO2: 94%  Weight: 140 lb 12.8 oz (63.9 kg)   Body mass index is 24.17 kg/m.  Advanced Directives 09/12/2021 02/28/2019 11/05/2016  Does Patient Have a Medical Advance Directive? No No No  Would patient like information on creating a medical advance directive? Yes (MAU/Ambulatory/Procedural Areas - Information given) Yes (MAU/Ambulatory/Procedural Areas - Information given) -    Current Medications (verified) Outpatient Encounter Medications as of 09/12/2021  Medication Sig   cetirizine (ZYRTEC) 10 MG tablet Take 10 mg by mouth daily.   metoprolol succinate (TOPROL-XL) 25 MG 24 hr tablet Take 1 tablet by mouth once daily   SUMAtriptan (IMITREX) 25 MG tablet Take 1 tablet (25 mg total) by mouth every 2 (two) hours as needed for migraine. May repeat in 2 hours if headache persists or recurs.   SYNTHROID 75 MCG tablet TAKE 1 TABLET BY MOUTH ONCE DAILY BEFORE BREAKFAST   No facility-administered encounter medications on file as of 09/12/2021.    Allergies (verified) Patient has no known allergies.   History: Past Medical History:  Diagnosis Date   Thyroid disease    Goiter   Past Surgical History:  Procedure Laterality Date   BREAST SURGERY     right lump   CATARACT EXTRACTION, BILATERAL     Dr Bing Plume   thyroid     TUBAL LIGATION     Family History  Problem Relation Age of Onset   Colon polyps Father 74   Arthritis Other    Cancer Other        colon, prostate   Heart disease Mother        CHF, died age 24   Lung cancer Brother    Stomach cancer Neg Hx    Social History   Socioeconomic History   Marital status: Legally Separated    Spouse  name: Not on file   Number of children: 2   Years of education: Not on file   Highest education level: Not on file  Occupational History   Not on file  Tobacco Use   Smoking status: Never   Smokeless tobacco: Never  Substance and Sexual Activity   Alcohol use: No   Drug use: No   Sexual activity: Not on file  Other Topics Concern   Not on file  Social History Narrative   Lives with husband, son and grandson.   Social Determinants of Health   Financial Resource Strain: Low Risk    Difficulty of Paying Living Expenses: Not hard at all  Food Insecurity: No Food Insecurity   Worried About Charity fundraiser in the Last Year: Never true   [place] in the Last Year: Never true  Transportation Needs: No Transportation Needs   Lack of Transportation (Medical): No   Lack of Transportation (Non-Medical): No  Physical Activity: Insufficiently Active   Days of Exercise per Week: 1 day   Minutes of Exercise per Session: 120 min  Stress: No Stress Concern Present   Feeling of Stress : Not at all  Social Connections: Moderately Isolated   Frequency of Communication with Friends and Family: More than three  times a week   Frequency of Social Gatherings with Friends and Family: More than three times a week   Attends Religious Services: Never   Marine scientist or Organizations: Yes   Attends Archivist Meetings: 1 to 4 times per year   Marital Status: Separated    Tobacco Counseling Counseling given: Not Answered   Clinical Intake:  Pre-visit preparation completed: Yes  Pain : No/denies pain     BMI - recorded: 24.17 Nutritional Status: BMI of 19-24  Normal Nutritional Risks: None Diabetes: No  How often do you need to have someone help you when you read instructions, pamphlets, or other written materials from your doctor or pharmacy?: 1 - Never  Diabetic?no  Interpreter Needed?: No  Information entered by :: Charlott Rakes, LPN   Activities  of Daily Living In your present state of health, do you have any difficulty performing the following activities: 09/12/2021  Hearing? N  Vision? N  Difficulty concentrating or making decisions? N  Walking or climbing stairs? N  Dressing or bathing? N  Doing errands, shopping? N  Preparing Food and eating ? N  Using the Toilet? N  In the past six months, have you accidently leaked urine? N  Do you have problems with loss of bowel control? N  Managing your Medications? N  Managing your Finances? N  Housekeeping or managing your Housekeeping? N  Some recent data might be hidden    Patient Care Team: Vivi Barrack, MD as PCP - General (Family Medicine) Allyn Kenner, DO as Consulting Physician (Obstetrics and Gynecology)  Indicate any recent Medical Services you may have received from other than [facility] providers in the past year (date may be approximate).     Assessment:   This is a routine wellness examination for Natalie [name].  Hearing/Vision screen Hearing Screening - Comments:: Pt denies any hearing issues  Vision Screening - Comments:: Pt follows up with Der Bing Plume for annual eye exams   Dietary issues and exercise activities discussed: Current Exercise Habits: Home exercise routine, Type of exercise: Other - see comments, Time (Minutes): > 60, Frequency (Times/Week): 1, Weekly Exercise (Minutes/Week): 0   Goals Addressed             This Visit's Progress    Patient Stated       Lose weight        Depression Screen PHQ 2/9 Scores 09/12/2021 08/15/2021 04/18/2021 08/13/2020 02/28/2019 08/19/2018 11/05/2016  PHQ - 2 Score 0 0 0 0 0 0 0    Fall Risk Fall Risk  09/12/2021 08/15/2021 04/18/2021 08/13/2020 08/01/2019  Falls in the past year? 0 0 0 0 0  Number falls in past yr: 0 0 0 - -  Injury with Fall? 0 0 0 - -  Follow up Falls prevention discussed - - - -    FALL RISK PREVENTION PERTAINING TO THE HOME:  Any stairs in or around the home? No  If so, are there any without  handrails? No  Home free of loose throw rugs in walkways, pet beds, electrical cords, etc? Yes  Adequate lighting in your home to reduce risk of falls? Yes   ASSISTIVE DEVICES UTILIZED TO PREVENT FALLS:  Life alert? No  Use of a cane, walker or w/c? No  Grab bars in the bathroom? No  Shower chair or bench in shower? No  Elevated toilet seat or a handicapped toilet? No   TIMED UP AND GO:  Was the test performed? Yes .  Length of time to ambulate 10 feet: 10 sec.   Gait steady and fast without use of assistive device  Cognitive Function: MMSE - Mini Mental State Exam 11/05/2016  Not completed: (No Data)     6CIT Screen 09/12/2021  What Year? 0 points  What month? 0 points  What time? 0 points  Count back from 20 0 points  Months in reverse 0 points  Repeat phrase 0 points  Total Score 0    Immunizations Immunization History  Administered Date(s) Administered   Fluad Quad(high Dose 65+) 04/12/2019, 05/22/2020, 04/02/2021   Influenza Inj Mdck Quad With Preservative 04/13/2018, 04/14/2019   Influenza Split 04/24/2011, 04/27/2012   Influenza Whole 04/17/2009   Influenza, High Dose Seasonal PF 04/28/2013, 04/24/2015, 04/17/2016, 04/22/2017   Influenza,inj,Quad PF,6+ Mos 05/01/2014, 04/07/2018   PFIZER(Purple Top)SARS-COV-2 Vaccination 08/08/2019, 08/29/2019, 05/05/2020   Pneumococcal Conjugate-13 05/01/2014   Pneumococcal Polysaccharide-23 04/28/2013, 04/24/2015   Td 07/14/1997, 04/17/2009    TDAP status: Due, Education has been provided regarding the importance of this vaccine. Advised may receive this vaccine at local pharmacy or Health Dept. Aware to provide a copy of the vaccination record if obtained from local pharmacy or Health Dept. Verbalized acceptance and understanding.  Flu Vaccine status: Up to date  Pneumococcal vaccine status: Up to date  Covid-19 vaccine status: Completed vaccines  Qualifies for Shingles Vaccine? Yes   Zostavax completed No   Shingrix  Completed?: No.    Education has been provided regarding the importance of this vaccine. Patient has been advised to call insurance company to determine out of pocket expense if they have not yet received this vaccine. Advised may also receive vaccine at local pharmacy or Health Dept. Verbalized acceptance and understanding.  Screening Tests Health Maintenance  Topic Date Due   Zoster Vaccines- Shingrix (1 of 2) Never done   TETANUS/TDAP  04/18/2019   COVID-19 Vaccine (4 - Booster for Pfizer series) 06/30/2020   COLONOSCOPY (Pts 45-37yrs Insurance coverage will need to be confirmed)  12/09/2021   MAMMOGRAM  01/12/2022   Pneumonia Vaccine 22+ Years old  Completed   INFLUENZA VACCINE  Completed   DEXA SCAN  Completed   Hepatitis C Screening  Completed   HPV VACCINES  Aged Out    Health Maintenance  Health Maintenance Due  Topic Date Due   Zoster Vaccines- Shingrix (1 of 2) Never done   TETANUS/TDAP  04/18/2019   COVID-19 Vaccine (4 - Booster for Pfizer series) 06/30/2020    Colorectal cancer screening: Type of screening: Colonoscopy. Completed 12/10/11. Repeat every 10 years  Mammogram status: Ordered 09/12/21. Pt provided with contact info and advised to call to schedule appt.   Bone Density status: Completed 09/13/18. Results reflect: Bone density results: OSTEOPOROSIS. Repeat every 2 years.   Additional Screening:  Hepatitis C Screening Completed 08/18/17  Vision Screening: Recommended annual ophthalmology exams for early detection of glaucoma and other disorders of the eye. Is the patient up to date with their annual eye exam?  Yes  Who is the provider or what is the name of the office in which the patient attends annual eye exams? [facility] If pt is not established with a provider, would they like to be referred to a provider to establish care? No .   Dental Screening: Recommended annual dental exams for proper oral hygiene  Community Resource Referral / Chronic Care  Management: CRR required this visit?  No   CCM required this visit?  No  Plan:     I have personally reviewed and noted the following in the patients chart:   Medical and social history Use of alcohol, tobacco or illicit drugs  Current medications and supplements including opioid prescriptions.  Functional ability and status Nutritional status Physical activity Advanced directives List of other physicians Hospitalizations, surgeries, and ER visits in previous 12 months Vitals Screenings to include cognitive, depression, and falls Referrals and appointments  In addition, I have reviewed and discussed with patient certain preventive protocols, quality metrics, and best practice recommendations. A written personalized care plan for preventive services as well as general preventive health recommendations were provided to patient.     Willette Brace, LPN   02/15/8591   Nurse Notes: None

## 2021-09-12 NOTE — Patient Instructions (Signed)
Natalie [name] , Thank you for taking time to come for your Medicare Wellness Visit. I appreciate your ongoing commitment to your health goals. Please review the following plan we discussed and let me know if I can assist you in the future.   Screening recommendations/referrals: Colonoscopy: Done 12/10/11 repeat every 10 years  Mammogram: order placed 09/12/21 to be scheduled 01/27/22 Bone Density: Done 09/13/18 repeat every 2 years  Recommended yearly ophthalmology/optometry visit for glaucoma screening and checkup Recommended yearly dental visit for hygiene and checkup  Vaccinations: Influenza vaccine: Done 04/02/21 repeat every  year  Pneumococcal vaccine: Up to date Tdap vaccine: Due and discussed Shingles vaccine: Shingrix discussed. Please contact your pharmacy for coverage information.   Covid-19:Completed 1/25, 2/15, & 05/05/20  Advanced directives: Advance directive discussed with you today. I have provided a copy for you to complete at home and have notarized. Once this is complete please bring a copy in to our office so we can scan it into your chart.  Conditions/risks identified: lose a few lbs  Next appointment: Follow up in one year for your annual wellness visit  Preventive Care 65 Years and Older, Female Preventive care refers to lifestyle choices and visits with your health care provider that can promote health and wellness. What does preventive care include? A yearly physical exam. This is also called an annual well check. Dental exams once or twice a year. Routine eye exams. Ask your health care provider how often you should have your eyes checked. Personal lifestyle choices, including: Daily care of your teeth and gums. Regular physical activity. Eating a healthy diet. Avoiding tobacco and drug use. Limiting alcohol use. Practicing safe sex. Taking low-dose aspirin every day. Taking vitamin and mineral supplements as recommended by your health care provider. What  happens during an annual well check? The services and screenings done by your health care provider during your annual well check will depend on your age, overall health, lifestyle risk factors, and family history of disease. Counseling  Your health care provider may ask you questions about your: Alcohol use. Tobacco use. Drug use. Emotional well-being. Home and relationship well-being. Sexual activity. Eating habits. History of falls. Memory and ability to understand (cognition). Work and work Statistician. Reproductive health. Screening  You may have the following tests or measurements: Height, weight, and BMI. Blood pressure. Lipid and cholesterol levels. These may be checked every 5 years, or more frequently if you are over 3 years old. Skin check. Lung cancer screening. You may have this screening every year starting at age 80 if you have a 30-pack-year history of smoking and currently smoke or have quit within the past 15 years. Fecal occult blood test (FOBT) of the stool. You may have this test every year starting at age 78. Flexible sigmoidoscopy or colonoscopy. You may have a sigmoidoscopy every 5 years or a colonoscopy every 10 years starting at age 72. Hepatitis C blood test. Hepatitis B blood test. Sexually transmitted disease (STD) testing. Diabetes screening. This is done by checking your blood sugar (glucose) after you have not eaten for a while (fasting). You may have this done every 1-3 years. Bone density scan. This is done to screen for osteoporosis. You may have this done starting at age 78. Mammogram. This may be done every 1-2 years. Talk to your health care provider about how often you should have regular mammograms. Talk with your health care provider about your test results, treatment options, and if necessary, the need for more tests.  Vaccines  Your health care provider may recommend certain vaccines, such as: Influenza vaccine. This is recommended every  year. Tetanus, diphtheria, and acellular pertussis (Tdap, Td) vaccine. You may need a Td booster every 10 years. Zoster vaccine. You may need this after age 60. Pneumococcal 13-valent conjugate (PCV13) vaccine. One dose is recommended after age 104. Pneumococcal polysaccharide (PPSV23) vaccine. One dose is recommended after age 79. Talk to your health care provider about which screenings and vaccines you need and how often you need them. This information is not intended to replace advice given to you by your health care provider. Make sure you discuss any questions you have with your health care provider. Document Released: 07/27/2015 Document Revised: 03/19/2016 Document Reviewed: 05/01/2015 Elsevier Interactive Patient Education  2017 [place] Prevention in the Home Falls can cause injuries. They can happen to people of all ages. There are many things you can do to make your home safe and to help prevent falls. What can I do on the outside of my home? Regularly fix the edges of walkways and driveways and fix any cracks. Remove anything that might make you trip as you walk through a door, such as a raised step or threshold. Trim any bushes or trees on the path to your home. Use bright outdoor lighting. Clear any walking paths of anything that might make someone trip, such as rocks or tools. Regularly check to see if handrails are loose or broken. Make sure that both sides of any steps have handrails. Any raised decks and porches should have guardrails on the edges. Have any leaves, snow, or ice cleared regularly. Use sand or salt on walking paths during winter. Clean up any spills in your garage right away. This includes oil or grease spills. What can I do in the bathroom? Use night lights. Install grab bars by the toilet and in the tub and shower. Do not use towel bars as grab bars. Use non-skid mats or decals in the tub or shower. If you need to sit down in the shower, use a  plastic, non-slip stool. Keep the floor dry. Clean up any water that spills on the floor as soon as it happens. Remove soap buildup in the tub or shower regularly. Attach bath mats securely with double-sided non-slip rug tape. Do not have throw rugs and other things on the floor that can make you trip. What can I do in the bedroom? Use night lights. Make sure that you have a light by your bed that is easy to reach. Do not use any sheets or blankets that are too big for your bed. They should not hang down onto the floor. Have a firm chair that has side arms. You can use this for support while you get dressed. Do not have throw rugs and other things on the floor that can make you trip. What can I do in the kitchen? Clean up any spills right away. Avoid walking on wet floors. Keep items that you use a lot in easy-to-reach places. If you need to reach something above you, use a strong step stool that has a grab bar. Keep electrical cords out of the way. Do not use floor polish or wax that makes floors slippery. If you must use wax, use non-skid floor wax. Do not have throw rugs and other things on the floor that can make you trip. What can I do with my stairs? Do not leave any items on the stairs. Make sure that  there are handrails on both sides of the stairs and use them. Fix handrails that are broken or loose. Make sure that handrails are as long as the stairways. Check any carpeting to make sure that it is firmly attached to the stairs. Fix any carpet that is loose or worn. Avoid having throw rugs at the top or bottom of the stairs. If you do have throw rugs, attach them to the floor with carpet tape. Make sure that you have a light switch at the top of the stairs and the bottom of the stairs. If you do not have them, ask someone to add them for you. What else can I do to help prevent falls? Wear shoes that: Do not have high heels. Have rubber bottoms. Are comfortable and fit you  well. Are closed at the toe. Do not wear sandals. If you use a stepladder: Make sure that it is fully opened. Do not climb a closed stepladder. Make sure that both sides of the stepladder are locked into place. Ask someone to hold it for you, if possible. Clearly mark and make sure that you can see: Any grab bars or handrails. First and last steps. Where the edge of each step is. Use tools that help you move around (mobility aids) if they are needed. These include: Canes. Walkers. Scooters. Crutches. Turn on the lights when you go into a dark area. Replace any light bulbs as soon as they burn out. Set up your furniture so you have a clear path. Avoid moving your furniture around. If any of your floors are uneven, fix them. If there are any pets around you, be aware of where they are. Review your medicines with your doctor. Some medicines can make you feel dizzy. This can increase your chance of falling. Ask your doctor what other things that you can do to help prevent falls. This information is not intended to replace advice given to you by your health care provider. Make sure you discuss any questions you have with your health care provider. Document Released: 04/26/2009 Document Revised: 12/06/2015 Document Reviewed: 08/04/2014 Elsevier Interactive Patient Education  2017 [employer].

## 2021-09-29 ENCOUNTER — Other Ambulatory Visit: Payer: Self-pay | Admitting: Family Medicine

## 2021-10-28 ENCOUNTER — Other Ambulatory Visit: Payer: Self-pay | Admitting: Family Medicine

## 2021-10-31 DIAGNOSIS — L209 Atopic dermatitis, unspecified: Secondary | ICD-10-CM | POA: Diagnosis not present

## 2021-10-31 DIAGNOSIS — Z411 Encounter for cosmetic surgery: Secondary | ICD-10-CM | POA: Diagnosis not present

## 2021-11-05 ENCOUNTER — Other Ambulatory Visit: Payer: Self-pay | Admitting: Family Medicine

## 2021-11-14 ENCOUNTER — Ambulatory Visit (INDEPENDENT_AMBULATORY_CARE_PROVIDER_SITE_OTHER): Payer: Medicare Other | Admitting: Family Medicine

## 2021-11-14 ENCOUNTER — Ambulatory Visit (INDEPENDENT_AMBULATORY_CARE_PROVIDER_SITE_OTHER)
Admission: RE | Admit: 2021-11-14 | Discharge: 2021-11-14 | Disposition: A | Discharge status: Home / Self Care | Payer: Medicare Other | Source: Ambulatory Visit | Attending: Family Medicine | Admitting: Family Medicine

## 2021-11-14 DIAGNOSIS — E039 Hypothyroidism, unspecified: Secondary | ICD-10-CM

## 2021-11-14 DIAGNOSIS — M1990 Unspecified osteoarthritis, unspecified site: Secondary | ICD-10-CM

## 2021-11-14 DIAGNOSIS — M25561 Pain in right knee: Secondary | ICD-10-CM | POA: Diagnosis not present

## 2021-11-14 DIAGNOSIS — M25551 Pain in right hip: Secondary | ICD-10-CM | POA: Diagnosis not present

## 2021-11-14 MED ORDER — MELOXICAM 15 MG PO TABS: 15.0000 mg | ORAL_TABLET | Freq: Every day | ORAL | 0 refills | Status: DC

## 2021-11-14 NOTE — Assessment & Plan Note (Signed)
Likely contributing to both her hip and knee pain.  We will be starting meloxicam as above.  We will check x-rays today.  Consider referral to PT or sports medicine if not improving. ?

## 2021-11-14 NOTE — Patient Instructions (Signed)
It was very nice to see you today! ? ?We will get an x-ray. ? ?You probably have little bit of arthritis I think you have a lot of inflammation in your muscles.  Please take the meloxicam every day for the next 1 to 2 weeks.  Work on the exercises.  Let me know if not improving and we can refer you to PT.  ? ?Take care, ?Dr Jerline Pain ? ?PLEASE NOTE: ? ?If you had any lab tests please let us know if you have not heard back within a few days. You may see your results on mychart before we have a chance to review them but we will give you a call once they are reviewed by [place]. If we ordered any referrals today, please let us know if you have not heard from their office within the next week.  ? ?Please try these tips to maintain a healthy lifestyle: ? ?Eat at least 3 REAL meals and 1-2 snacks per day.  Aim for no more than 5 hours between eating.  If you eat breakfast, please do so within one hour of getting up.  ? ?Each meal should contain half fruits/vegetables, one quarter protein, and one quarter carbs (no bigger than a computer mouse) ? ?Cut down on sweet beverages. This includes juice, soda, and sweet tea.  ? ?Drink at least 1 glass of water with each meal and aim for at least 8 glasses per day ? ?Exercise at least 150 minutes every week.   ?

## 2021-11-14 NOTE — Assessment & Plan Note (Signed)
Stable on Synthroid 75 mcg daily. ?

## 2021-11-14 NOTE — Progress Notes (Signed)
? ?  Natalie [name] is a 76 y.o. female who presents today for an office visit. ? ?Assessment/Plan:  ?New/Acute Problems: ?Rip Hip Pain ?Likely has underlying osteoarthritis however she has a significant degree of pain with resisted hip flexion and hip abduction today.  We discussed referral to physical therapy however she declined for now.  We will check plain films to further evaluate.  Start meloxicam 15 mg daily.  We discussed home exercise program and handout was given.  If symptoms or not improving will need referral to PT. ? ?Chronic Problems Addressed Today: ?Osteoarthritis ?Likely contributing to both her hip and knee pain.  We will be starting meloxicam as above.  We will check x-rays today.  Consider referral to PT or sports medicine if not improving. ? ?Hypothyroidism ?Stable on Synthroid 75 mcg daily. ? ?  ?Subjective:  ?HPI: ? ?Patient here with pain in her right hip. Started a few months. Interrupts her sleep at night.  No recent falls or injuries. No treatments tried. Heat seems to help. Symptoms stable the last few weeks. Radiates into groin. Whole leg is hurting.  Pain worse with certain motions such as twisting. ? ?   ?  ?Objective:  ?Physical Exam: ?BP 133/76   Pulse 64   Temp 98.4 ?F (36.9 ?C) (Temporal)   Ht '5\' 4"'$  (1.626 m)   Wt 142 lb 6.4 oz (64.6 kg)   SpO2 98%   BMI 24.44 kg/m?   ?Gen: No acute distress, resting comfortably ?MSK ?- Hip: No deformities.  Nontender to palpation.  Normal internal and external rotation.  Significant pain with resisted hip flexion and abduction. ?- Knee: No deformities.  Crepitus with active and passive range of motion.  Stable to varus and valgus stress.  Anterior and posterior drawer signs negative. ?Neuro: Grossly normal, moves all extremities ?Psych: Normal affect and thought content ? ?   ? ?Algis Greenhouse. Jerline Pain, MD ?11/14/2021 2:47 PM  ?

## 2021-11-15 NOTE — Progress Notes (Signed)
Please inform patient of the following: ? ?Her xrays show a little bit of arthritis but no other significant findings. Do not need to change her treatment plan at this time. Would like for her to let us know if not improving in 1-2 weeks. ? ?Natalie [name]. Jerline Pain, MD ?11/15/2021 1:04 PM  ?

## 2021-12-25 ENCOUNTER — Encounter: Payer: Self-pay | Admitting: Internal Medicine

## 2022-01-15 ENCOUNTER — Other Ambulatory Visit: Payer: Self-pay | Admitting: Family Medicine

## 2022-01-16 ENCOUNTER — Telehealth: Payer: Self-pay | Admitting: Family Medicine

## 2022-01-16 ENCOUNTER — Other Ambulatory Visit: Payer: Self-pay | Admitting: *Deleted

## 2022-01-16 MED ORDER — SUMATRIPTAN SUCCINATE 25 MG PO TABS: ORAL_TABLET | ORAL | 0 refills | Status: DC

## 2022-01-16 NOTE — Telephone Encounter (Signed)
Rx resend with #10

## 2022-01-16 NOTE — Telephone Encounter (Signed)
Patient called stating that upon picking up her Imitrex 25 mg that was called in today, she only received one tablet. According to the order, only 1 tablet was ordered instead of at least 30. Please Advise.

## 2022-01-27 ENCOUNTER — Ambulatory Visit
Admission: RE | Admit: 2022-01-27 | Discharge: 2022-01-27 | Disposition: A | Discharge status: Home / Self Care | Payer: Medicare Other | Source: Ambulatory Visit | Attending: Family Medicine | Admitting: Family Medicine

## 2022-01-27 ENCOUNTER — Other Ambulatory Visit: Payer: Self-pay | Admitting: Family Medicine

## 2022-01-27 DIAGNOSIS — M85851 Other specified disorders of bone density and structure, right thigh: Secondary | ICD-10-CM | POA: Diagnosis not present

## 2022-01-27 DIAGNOSIS — Z0001 Encounter for general adult medical examination with abnormal findings: Secondary | ICD-10-CM

## 2022-01-27 DIAGNOSIS — Z1231 Encounter for screening mammogram for malignant neoplasm of breast: Secondary | ICD-10-CM

## 2022-01-27 DIAGNOSIS — M810 Age-related osteoporosis without current pathological fracture: Secondary | ICD-10-CM | POA: Diagnosis not present

## 2022-01-27 DIAGNOSIS — Z780 Asymptomatic menopausal state: Secondary | ICD-10-CM | POA: Diagnosis not present

## 2022-01-27 DIAGNOSIS — E2839 Other primary ovarian failure: Secondary | ICD-10-CM

## 2022-01-29 DIAGNOSIS — H524 Presbyopia: Secondary | ICD-10-CM | POA: Diagnosis not present

## 2022-01-29 NOTE — Progress Notes (Signed)
Please inform patient of the following:  Her bone density scan shows that she has osteoporosis.  She can schedule appointment with [place] to discuss treatment options or we can refer to endocrinology to discuss.

## 2022-02-06 ENCOUNTER — Ambulatory Visit (INDEPENDENT_AMBULATORY_CARE_PROVIDER_SITE_OTHER): Payer: Medicare Other | Admitting: Family Medicine

## 2022-02-06 ENCOUNTER — Encounter: Payer: Self-pay | Admitting: Family Medicine

## 2022-02-06 VITALS — BP 121/70 | HR 61 | Temp 97.3°F | Ht 64.0 in | Wt 142.2 lb

## 2022-02-06 DIAGNOSIS — E039 Hypothyroidism, unspecified: Secondary | ICD-10-CM | POA: Diagnosis not present

## 2022-02-06 DIAGNOSIS — H02403 Unspecified ptosis of bilateral eyelids: Secondary | ICD-10-CM | POA: Diagnosis not present

## 2022-02-06 DIAGNOSIS — M810 Age-related osteoporosis without current pathological fracture: Secondary | ICD-10-CM

## 2022-02-06 DIAGNOSIS — M1990 Unspecified osteoarthritis, unspecified site: Secondary | ICD-10-CM | POA: Diagnosis not present

## 2022-02-06 NOTE — Assessment & Plan Note (Signed)
Recent DEXA scan did show T score -4.4 at her distal radius. Otherwise, the remainder of her scan was stable.  Per the FRAX calculator, her 10-year risk for major osteoporotic fracture is 6.0% and for hip fracture 0.4%.  She has already completed a course of Fosamax however she does not remember the duration that thinks that it was a least for 5 years.  She may benefit from starting Prolia.  She can discuss further with her gynecologist that we will refer her to endocrinology to discuss further.

## 2022-02-06 NOTE — Assessment & Plan Note (Signed)
Stable.  He uses meloxicam as needed.  We discussed home exercises today and handout was given.  She will let me know if things are not improving and we can refer to PT and/or sports medicine.

## 2022-02-06 NOTE — Progress Notes (Signed)
   Dreana Britz Olivencia is a 76 y.o. female who presents today for an office visit.  Assessment/Plan:  Chronic Problems Addressed Today: Osteoporosis Recent DEXA scan did show T score -4.4 at her distal radius. Otherwise, the remainder of her scan was stable.  Per the FRAX calculator, her 10-year risk for major osteoporotic fracture is 6.0% and for hip fracture 0.4%.  She has already completed a course of Fosamax however she does not remember the duration that thinks that it was a least for 5 years.  She may benefit from starting Prolia.  She can discuss further with her gynecologist that we will refer her to endocrinology to discuss further.  Hypothyroidism Last TSH at goal on Synthroid 75 mcg daily.  Osteoarthritis Stable.  He uses meloxicam as needed.  We discussed home exercises today and handout was given.  She will let me know if things are not improving and we can refer to PT and/or sports medicine.     Subjective:  HPI:  See A/P for status of chronic conditions.       Objective:  Physical Exam: BP 121/70   Pulse 61   Temp (!) 97.3 F (36.3 C) (Temporal)   Ht '5\' 4"'$  (1.626 m)   Wt 142 lb 3.2 oz (64.5 kg)   SpO2 99%   BMI 24.41 kg/m   Gen: No acute distress, resting comfortably Neuro: Grossly normal, moves all extremities Psych: Normal affect and thought content      Summerlynn Glauser M. Jerline Pain, MD 02/06/2022 11:08 AM

## 2022-02-06 NOTE — Assessment & Plan Note (Signed)
Last TSH at goal on Synthroid 75 mcg daily.

## 2022-02-06 NOTE — Patient Instructions (Signed)
It was very nice to see you today!  I will refer you to the endocrinologist to discuss your bone density scan.  You would probably benefit from starting a medication called Prolia which is a twice yearly injection that helps build the bone back up.  We can do this at this office or you can have this done with endocrinology.  I will refer you to see a plastic surgeon for the spots on your eyes.  I will see back in about 6 months for your annual checkup with labs.  Come back sooner if needed.  Take care, Dr Jerline Pain  PLEASE NOTE:  If you had any lab tests please let us know if you have not heard back within a few days. You may see your results on mychart before we have a chance to review them but we will give you a call once they are reviewed by [place]. If we ordered any referrals today, please let us know if you have not heard from their office within the next week.   Please try these tips to maintain a healthy lifestyle:  Eat at least 3 REAL meals and 1-2 snacks per day.  Aim for no more than 5 hours between eating.  If you eat breakfast, please do so within one hour of getting up.   Each meal should contain half fruits/vegetables, one quarter protein, and one quarter carbs (no bigger than a computer mouse)  Cut down on sweet beverages. This includes juice, soda, and sweet tea.   Drink at least 1 glass of water with each meal and aim for at least 8 glasses per day  Exercise at least 150 minutes every week.

## 2022-02-14 DIAGNOSIS — Z01419 Encounter for gynecological examination (general) (routine) without abnormal findings: Secondary | ICD-10-CM | POA: Diagnosis not present

## 2022-02-18 DIAGNOSIS — L988 Other specified disorders of the skin and subcutaneous tissue: Secondary | ICD-10-CM | POA: Diagnosis not present

## 2022-02-18 DIAGNOSIS — L304 Erythema intertrigo: Secondary | ICD-10-CM | POA: Diagnosis not present

## 2022-03-03 ENCOUNTER — Other Ambulatory Visit: Payer: Self-pay | Admitting: Family Medicine

## 2022-03-23 ENCOUNTER — Other Ambulatory Visit: Payer: Self-pay | Admitting: Family Medicine

## 2022-04-07 ENCOUNTER — Encounter: Payer: Self-pay | Admitting: *Deleted

## 2022-04-08 ENCOUNTER — Ambulatory Visit (INDEPENDENT_AMBULATORY_CARE_PROVIDER_SITE_OTHER): Payer: Medicare Other

## 2022-04-08 DIAGNOSIS — Z23 Encounter for immunization: Secondary | ICD-10-CM

## 2022-04-08 NOTE — Progress Notes (Signed)
Pt here for flu vaccine for Dr.Parker. Injection given in left deltoid. Pt tolerated well.

## 2022-04-16 DIAGNOSIS — L2089 Other atopic dermatitis: Secondary | ICD-10-CM | POA: Diagnosis not present

## 2022-04-19 ENCOUNTER — Other Ambulatory Visit: Payer: Self-pay | Admitting: Family Medicine

## 2022-06-15 ENCOUNTER — Other Ambulatory Visit: Payer: Self-pay | Admitting: Family Medicine

## 2022-06-26 ENCOUNTER — Ambulatory Visit (INDEPENDENT_AMBULATORY_CARE_PROVIDER_SITE_OTHER): Payer: Medicare Other | Admitting: Family Medicine

## 2022-06-26 ENCOUNTER — Encounter: Payer: Self-pay | Admitting: Family Medicine

## 2022-06-26 VITALS — BP 145/83 | HR 70 | Temp 97.3°F | Ht 64.0 in | Wt 148.4 lb

## 2022-06-26 DIAGNOSIS — M1990 Unspecified osteoarthritis, unspecified site: Secondary | ICD-10-CM

## 2022-06-26 DIAGNOSIS — G43101 Migraine with aura, not intractable, with status migrainosus: Secondary | ICD-10-CM

## 2022-06-26 DIAGNOSIS — E039 Hypothyroidism, unspecified: Secondary | ICD-10-CM

## 2022-06-26 DIAGNOSIS — E785 Hyperlipidemia, unspecified: Secondary | ICD-10-CM | POA: Diagnosis not present

## 2022-06-26 LAB — COMPREHENSIVE METABOLIC PANEL
ALT: 21 U/L (ref 0–35)
AST: 18 U/L (ref 0–37)
Albumin: 4.2 g/dL (ref 3.5–5.2)
Alkaline Phosphatase: 93 U/L (ref 39–117)
BUN: 16 mg/dL (ref 6–23)
CO2: 27 mEq/L (ref 19–32)
Calcium: 9.7 mg/dL (ref 8.4–10.5)
Chloride: 103 mEq/L (ref 96–112)
Creatinine, Ser: 0.82 mg/dL (ref 0.40–1.20)
GFR: 70.29 mL/min (ref >60.00)
Glucose, Bld: 95 mg/dL (ref 70–99)
Potassium: 5.1 mEq/L (ref 3.5–5.1)
Sodium: 139 mEq/L (ref 135–145)
Total Bilirubin: 0.3 mg/dL (ref 0.2–1.2)
Total Protein: 7.7 g/dL (ref 6.0–8.3)

## 2022-06-26 LAB — CBC
HCT: 40.2 % (ref 36.0–46.0)
Hemoglobin: 13.7 g/dL (ref 12.0–15.0)
MCHC: 34 g/dL (ref 30.0–36.0)
MCV: 90.3 fl (ref 78.0–100.0)
Platelets: 303 10*3/uL (ref 150.0–400.0)
RBC: 4.46 Mil/uL (ref 3.87–5.11)
RDW: 13.1 % (ref 11.5–15.5)
WBC: 7.1 10*3/uL (ref 4.0–10.5)

## 2022-06-26 LAB — TSH: TSH: 1.1 u[IU]/mL (ref 0.35–5.50)

## 2022-06-26 NOTE — Assessment & Plan Note (Signed)
Migraines are overall stable.  Has not had to use Imitrex much recently.  She is on metoprolol succinate as prophylactic and tolerating well.

## 2022-06-26 NOTE — Assessment & Plan Note (Signed)
Doing well on meloxicam as needed.

## 2022-06-26 NOTE — Progress Notes (Signed)
Natalie [name] is a 76 y.o. female who presents today for an office visit.  Assessment/Plan:  New/Acute Problems: Headache No red flags.  She has a reassuring neuroexam today and has not had any recurrence for the last few days-do not think we need to obtain any imaging at this point.  Based on description and her autonomic symptoms sounds like she is probably having cluster headaches.  She has been diagnosed with this in the past for previous PCP.  Discussed that she can try using Imitrex if she has another headache of this type in the near future.  She has not yet tried Imitrex for these type of headaches.  She will let me know if her headaches do not respond to Imitrex or if her symptoms are not improving over the next several days or if her symptoms change in any way.  If she does not have response to Imitrex would consider imaging versus referral to neurology at that time.  Elevated BP Reading Initially elevated 155/84 however improved to 145/83 on recheck.  She has typically been well-controlled.  She will continue to monitor at home and let us know if persistently 140/90 or higher.  Chronic Problems Addressed Today: Hypothyroidism Patient concerned that her dose is too low.  She is having some occasional chills and has had some weight gain.  She is currently on Synthroid 75 mcg daily.  Migraine headache with aura Migraines are overall stable.  Has not had to use Imitrex much recently.  She is on metoprolol succinate as prophylactic and tolerating well.  Dyslipidemia Last LDL 139.  We did discuss starting statin however she deferred for now.  She would like to wait until she comes back for CPE in a couple months to recheck lipids at that time.  Osteoarthritis Doing well on meloxicam as needed.     Subjective:  HPI:  See A/p for status of chronic conditions.    Her main concern today is intermittent headache. This has been going on for a couple of weeks.  Initially was  happening ever couple of days.  Headaches can last for several hours when they happen.  Headaches feel different than her typical migraines.  She will have a painful sensation in her temple area then will develop runny nose.  No watery eyes.  No facial pain.  No sinus pressure or nasal congestion.  No vision changes.  No weakness or numbness.  No nausea or vomiting.  She was concerned about possible allergies and started taking Zyrtec.  She has not had any recurrence of symptoms for the last few days.        Objective:  Physical Exam: BP (!) 150/84 (BP Location: Left Arm, Patient Position: Sitting)   Pulse 70   Temp (!) 97.3 F (36.3 C) (Temporal)   Ht '5\' 4"'$  (1.626 m)   Wt 148 lb 6.4 oz (67.3 kg)   SpO2 98%   BMI 25.47 kg/m   Wt Readings from Last 3 Encounters:  06/26/22 148 lb 6.4 oz (67.3 kg)  02/06/22 142 lb 3.2 oz (64.5 kg)  11/14/21 142 lb 6.4 oz (64.6 kg)    Gen: No acute distress, resting comfortably HEENT: TMs clear.  Nasal mucosa normal CV: Regular rate and rhythm with no murmurs appreciated Pulm: Normal work of breathing, clear to auscultation bilaterally with no crackles, wheezes, or rhonchi Neuro: Cranial nerves II through XII intact.  Strength 5 out of 5 in upper and lower extremities.  Sensation light touch  intact throughout Psych: Normal affect and thought content      Johnie Stadel M. Jerline Pain, MD 06/26/2022 9:28 AM

## 2022-06-26 NOTE — Assessment & Plan Note (Signed)
Last LDL 139.  We did discuss starting statin however she deferred for now.  She would like to wait until she comes back for CPE in a couple months to recheck lipids at that time.

## 2022-06-26 NOTE — Assessment & Plan Note (Signed)
Patient concerned that her dose is too low.  She is having some occasional chills and has had some weight gain.  She is currently on Synthroid 75 mcg daily.

## 2022-06-26 NOTE — Patient Instructions (Signed)
It was very nice to see you today!  I think you are probably having cluster headaches.  I hope that these have run their course and would not come back.  If you do have another cluster headache, please try using the Imitrex.  You can continue using the Zyrtec.  We will check blood work today.  Please keep an eye on your blood pressure and let us know if it is persistently elevated above 140/90 or higher.   I will see you back in a couple months for your annual physical.  Come back sooner if needed.  Take care, Dr Jerline Pain  PLEASE NOTE:  If you had any lab tests please let us know if you have not heard back within a few days. You may see your results on mychart before we have a chance to review them but we will give you a call once they are reviewed by [place]. If we ordered any referrals today, please let us know if you have not heard from their office within the next week.   Please try these tips to maintain a healthy lifestyle:  Eat at least 3 REAL meals and 1-2 snacks per day.  Aim for no more than 5 hours between eating.  If you eat breakfast, please do so within one hour of getting up.   Each meal should contain half fruits/vegetables, one quarter protein, and one quarter carbs (no bigger than a computer mouse)  Cut down on sweet beverages. This includes juice, soda, and sweet tea.   Drink at least 1 glass of water with each meal and aim for at least 8 glasses per day  Exercise at least 150 minutes every week.

## 2022-06-27 NOTE — Progress Notes (Signed)
Please inform patient of the following:  Labs are all normal.  Do not need to make any changes to her treatment plan at this time.  We can recheck at her next office visit.

## 2022-07-15 DIAGNOSIS — H26493 Other secondary cataract, bilateral: Secondary | ICD-10-CM | POA: Diagnosis not present

## 2022-07-15 DIAGNOSIS — Z83511 Family history of glaucoma: Secondary | ICD-10-CM | POA: Diagnosis not present

## 2022-07-15 DIAGNOSIS — H02835 Dermatochalasis of left lower eyelid: Secondary | ICD-10-CM | POA: Diagnosis not present

## 2022-07-17 ENCOUNTER — Other Ambulatory Visit: Payer: Self-pay | Admitting: Family Medicine

## 2022-08-20 ENCOUNTER — Ambulatory Visit (INDEPENDENT_AMBULATORY_CARE_PROVIDER_SITE_OTHER): Payer: Medicare Other | Admitting: Family Medicine

## 2022-08-20 ENCOUNTER — Encounter: Payer: Self-pay | Admitting: Family Medicine

## 2022-08-20 VITALS — BP 138/83 | HR 60 | Temp 97.5°F | Ht 64.0 in | Wt 146.0 lb

## 2022-08-20 DIAGNOSIS — E785 Hyperlipidemia, unspecified: Secondary | ICD-10-CM

## 2022-08-20 DIAGNOSIS — M1990 Unspecified osteoarthritis, unspecified site: Secondary | ICD-10-CM

## 2022-08-20 DIAGNOSIS — E538 Deficiency of other specified B group vitamins: Secondary | ICD-10-CM

## 2022-08-20 DIAGNOSIS — Z0001 Encounter for general adult medical examination with abnormal findings: Secondary | ICD-10-CM

## 2022-08-20 DIAGNOSIS — G43101 Migraine with aura, not intractable, with status migrainosus: Secondary | ICD-10-CM

## 2022-08-20 DIAGNOSIS — Z1211 Encounter for screening for malignant neoplasm of colon: Secondary | ICD-10-CM

## 2022-08-20 DIAGNOSIS — M810 Age-related osteoporosis without current pathological fracture: Secondary | ICD-10-CM

## 2022-08-20 DIAGNOSIS — E559 Vitamin D deficiency, unspecified: Secondary | ICD-10-CM

## 2022-08-20 DIAGNOSIS — R739 Hyperglycemia, unspecified: Secondary | ICD-10-CM | POA: Diagnosis not present

## 2022-08-20 DIAGNOSIS — E039 Hypothyroidism, unspecified: Secondary | ICD-10-CM

## 2022-08-20 LAB — LIPID PANEL
Cholesterol: 234 mg/dL — ABNORMAL HIGH (ref 0–200)
HDL: 47.4 mg/dL (ref >39.00)
LDL Cholesterol: 158 mg/dL — ABNORMAL HIGH (ref 0–99)
NonHDL: 186.69
Total CHOL/HDL Ratio: 5
Triglycerides: 145 mg/dL (ref 0.0–149.0)
VLDL: 29 mg/dL (ref 0.0–40.0)

## 2022-08-20 LAB — CBC
HCT: 39.8 % (ref 36.0–46.0)
Hemoglobin: 13.4 g/dL (ref 12.0–15.0)
MCHC: 33.6 g/dL (ref 30.0–36.0)
MCV: 91.1 fl (ref 78.0–100.0)
Platelets: 282 10*3/uL (ref 150.0–400.0)
RBC: 4.37 Mil/uL (ref 3.87–5.11)
RDW: 13.3 % (ref 11.5–15.5)
WBC: 6.6 10*3/uL (ref 4.0–10.5)

## 2022-08-20 LAB — COMPREHENSIVE METABOLIC PANEL
ALT: 15 U/L (ref 0–35)
AST: 15 U/L (ref 0–37)
Albumin: 4.3 g/dL (ref 3.5–5.2)
Alkaline Phosphatase: 95 U/L (ref 39–117)
BUN: 14 mg/dL (ref 6–23)
CO2: 26 mEq/L (ref 19–32)
Calcium: 9.4 mg/dL (ref 8.4–10.5)
Chloride: 104 mEq/L (ref 96–112)
Creatinine, Ser: 0.74 mg/dL (ref 0.40–1.20)
GFR: 79.42 mL/min (ref >60.00)
Glucose, Bld: 89 mg/dL (ref 70–99)
Potassium: 4.5 mEq/L (ref 3.5–5.1)
Sodium: 139 mEq/L (ref 135–145)
Total Bilirubin: 0.5 mg/dL (ref 0.2–1.2)
Total Protein: 7.7 g/dL (ref 6.0–8.3)

## 2022-08-20 LAB — HEMOGLOBIN A1C: Hgb A1c MFr Bld: 5.9 % (ref 4.6–6.5)

## 2022-08-20 LAB — VITAMIN D 25 HYDROXY (VIT D DEFICIENCY, FRACTURES): VITD: 21.46 ng/mL — ABNORMAL LOW (ref 30.00–100.00)

## 2022-08-20 LAB — VITAMIN B12: Vitamin B-12: 234 pg/mL (ref 211–911)

## 2022-08-20 LAB — TSH: TSH: 0.64 u[IU]/mL (ref 0.35–5.50)

## 2022-08-20 MED ORDER — METOPROLOL SUCCINATE ER 25 MG PO TB24: 25.0000 mg | ORAL_TABLET | Freq: Every day | ORAL | 3 refills | Status: DC

## 2022-08-20 MED ORDER — SUMATRIPTAN SUCCINATE 25 MG PO TABS: ORAL_TABLET | ORAL | 0 refills | Status: DC

## 2022-08-20 MED ORDER — SYNTHROID 75 MCG PO TABS: 75.0000 ug | ORAL_TABLET | Freq: Every day | ORAL | 3 refills | Status: DC

## 2022-08-20 MED ORDER — MELOXICAM 15 MG PO TABS: 15.0000 mg | ORAL_TABLET | Freq: Every day | ORAL | 0 refills | Status: DC

## 2022-08-20 NOTE — Assessment & Plan Note (Signed)
Check vitamin D. 

## 2022-08-20 NOTE — Assessment & Plan Note (Signed)
Last TSH at goal.  Continue Synthroid 75 mcg daily.

## 2022-08-20 NOTE — Progress Notes (Signed)
Chief Complaint:  Natalie [name] is a 77 y.o. female who presents today for her annual comprehensive physical exam.    Assessment/Plan:  Chronic Problems Addressed Today: Hypothyroidism Last TSH at goal.  Continue Synthroid 75 mcg daily.  Osteoporosis Continue management per GYN.   Migraine headache with aura No recent flares.  Will refill Imitrex to use as needed.  She is also on metoprolol 25 mg daily as prophylaxis.  Dyslipidemia Check lipids.  Discussed style modifications.  Vitamin D deficiency Check vitamin D.  Vitamin B12 deficiency Check B12  Hyperglycemia Check A1c.   Osteoarthritis Stable meloxicam as needed.  Will refill today.  Preventative Healthcare: Check labs.  Up-to-date on vaccines.  Due for colon cancer screening-will order Cologuard.  Colonoscopy 10 years ago was normal without any polyps.  Patient Counseling(The following topics were reviewed and/or handout was given):  -Nutrition: Stressed importance of moderation in sodium/caffeine intake, saturated fat and cholesterol, caloric balance, sufficient intake of fresh fruits, vegetables, and fiber.  -Stressed the importance of regular exercise.   -Substance Abuse: Discussed cessation/primary prevention of tobacco, alcohol, or other drug use; driving or other dangerous activities under the influence; availability of treatment for abuse.   -Injury prevention: Discussed safety belts, safety helmets, smoke detector, smoking near bedding or upholstery.   -Sexuality: Discussed sexually transmitted diseases, partner selection, use of condoms, avoidance of unintended pregnancy and contraceptive alternatives.   -Dental health: Discussed importance of regular tooth brushing, flossing, and dental visits.  -Health maintenance and immunizations reviewed. Please refer to Health maintenance section.  Return to care in 1 year for next preventative visit.     Subjective:  HPI:  She has no acute complaints  today.   Lifestyle Diet: Plenty of fruits and vegetables.  Exercise: Busy with work. Dances every week.     08/20/2022    9:13 AM  Depression screen PHQ 2/9  Decreased Interest 0  Down, Depressed, Hopeless 0  PHQ - 2 Score 0    Health Maintenance Due  Topic Date Due   Fecal DNA (Cologuard)  Never done   DTaP/Tdap/Td (3 - Tdap) 04/18/2019   Medicare Annual Wellness (AWV)  09/13/2022     ROS: Per HPI, otherwise a complete review of systems was negative.   PMH:  The following were reviewed and entered/updated in epic: Past Medical History:  Diagnosis Date   Thyroid disease    Goiter   Patient Active Problem List   Diagnosis Date Noted   Osteoarthritis 11/14/2021   Varicose veins of both lower extremities with inflammation 04/18/2021   Vitamin D deficiency 08/13/2020   Vitamin B12 deficiency 08/13/2020   Hyperglycemia 08/13/2020   Dyslipidemia 08/23/2018   Migraine headache with aura 03/30/2014   Tachycardia 12/12/2011   Hypothyroidism 04/14/2007   Osteoporosis 04/14/2007   Past Surgical History:  Procedure Laterality Date   BREAST EXCISIONAL BIOPSY Right    BREAST SURGERY     right lump   CATARACT EXTRACTION, BILATERAL     Dr Bing Plume   thyroid     TUBAL LIGATION      Family History  Problem Relation Age of Onset   Colon polyps Father 70   Arthritis Other    Cancer Other        colon, prostate   Heart disease Mother        CHF, died age 30   Lung cancer Brother    Stomach cancer Neg Hx     Medications- reviewed and updated Current Outpatient  Medications  Medication Sig Dispense Refill   cetirizine (ZYRTEC) 10 MG tablet Take 10 mg by mouth daily.     meloxicam (MOBIC) 15 MG tablet Take 1 tablet (15 mg total) by mouth daily. 30 tablet 0   metoprolol succinate (TOPROL-XL) 25 MG 24 hr tablet Take 1 tablet (25 mg total) by mouth daily. 90 tablet 3   SUMAtriptan (IMITREX) 25 MG tablet TAKE 1 TABLET BY MOUTH AS NEEDED FOR MIGRAINE. MAY REPEAT IN 2 HOURS IF  HEADACHE PERSISTS OR RECURS 10 tablet 0   SYNTHROID 75 MCG tablet Take 1 tablet (75 mcg total) by mouth daily before breakfast. 90 tablet 3   No current facility-administered medications for this visit.    Allergies-reviewed and updated No Known Allergies  Social History   Socioeconomic History   Marital status: Legally Separated    Spouse name: Not on file   Number of children: 2   Years of education: Not on file   Highest education level: Not on file  Occupational History   Not on file  Tobacco Use   Smoking status: Never   Smokeless tobacco: Never  Substance and Sexual Activity   Alcohol use: No   Drug use: No   Sexual activity: Not on file  Other Topics Concern   Not on file  Social History Narrative   Lives with husband, son and grandson.   Social Determinants of Health   Financial Resource Strain: Low Risk  (09/12/2021)   Overall Financial Resource Strain (CARDIA)    Difficulty of Paying Living Expenses: Not hard at all  Food Insecurity: No Food Insecurity (09/12/2021)   Hunger Vital Sign    Worried About Running Out of Food in the Last Year: Never true    Ran Out of Food in the Last Year: Never true  Transportation Needs: No Transportation Needs (09/12/2021)   PRAPARE - Hydrologist (Medical): No    Lack of Transportation (Non-Medical): No  Physical Activity: Insufficiently Active (09/12/2021)   Exercise Vital Sign    Days of Exercise per Week: 1 day    Minutes of Exercise per Session: 120 min  Stress: No Stress Concern Present (09/12/2021)   [place]    Feeling of Stress : Not at all  Social Connections: Moderately Isolated (09/12/2021)   Social Connection and Isolation Panel [NHANES]    Frequency of Communication with Friends and Family: More than three times a week    Frequency of Social Gatherings with Friends and Family: More than three times a week    Attends  Religious Services: Never    Marine scientist or Organizations: Yes    Attends Archivist Meetings: 1 to 4 times per year    Marital Status: Separated        Objective:  Physical Exam: BP 138/83   Pulse 60   Temp (!) 97.5 F (36.4 C) (Temporal)   Ht '5\' 4"'$  (1.626 m)   Wt 146 lb (66.2 kg)   SpO2 100%   BMI 25.06 kg/m   Body mass index is 25.06 kg/m. Wt Readings from Last 3 Encounters:  08/20/22 146 lb (66.2 kg)  06/26/22 148 lb 6.4 oz (67.3 kg)  02/06/22 142 lb 3.2 oz (64.5 kg)   Gen: NAD, resting comfortably HEENT: TMs normal bilaterally. OP clear. No thyromegaly noted.  CV: RRR with no murmurs appreciated Pulm: NWOB, CTAB with no crackles, wheezes, or rhonchi GI:  Normal bowel sounds present. Soft, Nontender, Nondistended. MSK: no edema, cyanosis, or clubbing noted Skin: warm, dry Neuro: CN2-12 grossly intact. Strength 5/5 in upper and lower extremities. Reflexes symmetric and intact bilaterally.  Psych: Normal affect and thought content     Keandria Berrocal M. Jerline Pain, MD 08/20/2022 9:53 AM

## 2022-08-20 NOTE — Assessment & Plan Note (Signed)
Check lipids.  Discussed style modifications.

## 2022-08-20 NOTE — Assessment & Plan Note (Signed)
No recent flares.  Will refill Imitrex to use as needed.  She is also on metoprolol 25 mg daily as prophylaxis.

## 2022-08-20 NOTE — Assessment & Plan Note (Signed)
Check A1c. 

## 2022-08-20 NOTE — Patient Instructions (Signed)
It was very nice to see you today!  I will refill your medications today.  We will check blood work.  We will order Cologuard.  Will see you back in year for your next physical.  Come back sooner if needed.  Take care, Dr Jerline Pain  PLEASE NOTE:  If you had any lab tests, please let us know if you have not heard back within a few days. You may see your results on mychart before we have a chance to review them but we will give you a call once they are reviewed by [place].   If we ordered any referrals today, please let us know if you have not heard from their office within the next week.   If you had any urgent prescriptions sent in today, please check with the pharmacy within an hour of our visit to make sure the prescription was transmitted appropriately.   Please try these tips to maintain a healthy lifestyle:  Eat at least 3 REAL meals and 1-2 snacks per day.  Aim for no more than 5 hours between eating.  If you eat breakfast, please do so within one hour of getting up.   Each meal should contain half fruits/vegetables, one quarter protein, and one quarter carbs (no bigger than a computer mouse)  Cut down on sweet beverages. This includes juice, soda, and sweet tea.   Drink at least 1 glass of water with each meal and aim for at least 8 glasses per day  Exercise at least 150 minutes every week.    Preventive Care 41 Years and Older, Female Preventive care refers to lifestyle choices and visits with your health care provider that can promote health and wellness. Preventive care visits are also called wellness exams. What can I expect for my preventive care visit? Counseling Your health care provider may ask you questions about your: Medical history, including: Past medical problems. Family medical history. Pregnancy and menstrual history. History of falls. Current health, including: Memory and ability to understand (cognition). Emotional well-being. Home life and relationship  well-being. Sexual activity and sexual health. Lifestyle, including: Alcohol, nicotine or tobacco, and drug use. Access to firearms. Diet, exercise, and sleep habits. Work and work Statistician. Sunscreen use. Safety issues such as seatbelt and bike helmet use. Physical exam Your health care provider will check your: Height and weight. These may be used to calculate your BMI (body mass index). BMI is a measurement that tells if you are at a healthy weight. Waist circumference. This measures the distance around your waistline. This measurement also tells if you are at a healthy weight and may help predict your risk of certain diseases, such as type 2 diabetes and high blood pressure. Heart rate and blood pressure. Body temperature. Skin for abnormal spots. What immunizations do I need?  Vaccines are usually given at various ages, according to a schedule. Your health care provider will recommend vaccines for you based on your age, medical history, and lifestyle or other factors, such as travel or where you work. What tests do I need? Screening Your health care provider may recommend screening tests for certain conditions. This may include: Lipid and cholesterol levels. Hepatitis C test. Hepatitis B test. HIV (human immunodeficiency virus) test. STI (sexually transmitted infection) testing, if you are at risk. Lung cancer screening. Colorectal cancer screening. Diabetes screening. This is done by checking your blood sugar (glucose) after you have not eaten for a while (fasting). Mammogram. Talk with your health care provider about how  often you should have regular mammograms. BRCA-related cancer screening. This may be done if you have a family history of breast, ovarian, tubal, or peritoneal cancers. Bone density scan. This is done to screen for osteoporosis. Talk with your health care provider about your test results, treatment options, and if necessary, the need for more tests. Follow  these instructions at home: Eating and drinking  Eat a diet that includes fresh fruits and vegetables, whole grains, lean protein, and low-fat dairy products. Limit your intake of foods with high amounts of sugar, saturated fats, and salt. Take vitamin and mineral supplements as recommended by your health care provider. Do not drink alcohol if your health care provider tells you not to drink. If you drink alcohol: Limit how much you have to 0-1 drink a day. Know how much alcohol is in your drink. In the [place] one drink equals one 12 oz bottle of beer (355 mL), one 5 oz glass of wine (148 mL), or one 1 oz glass of hard liquor (44 mL). Lifestyle Brush your teeth every morning and night with fluoride toothpaste. Floss one time each day. Exercise for at least 30 minutes 5 or more days each week. Do not use any products that contain nicotine or tobacco. These products include cigarettes, chewing tobacco, and vaping devices, such as e-cigarettes. If you need help quitting, ask your health care provider. Do not use drugs. If you are sexually active, practice safe sex. Use a condom or other form of protection in order to prevent STIs. Take aspirin only as told by your health care provider. Make sure that you understand how much to take and what form to take. Work with your health care provider to find out whether it is safe and beneficial for you to take aspirin daily. Ask your health care provider if you need to take a cholesterol-lowering medicine (statin). Find healthy ways to manage stress, such as: Meditation, yoga, or listening to music. Journaling. Talking to a trusted person. Spending time with friends and family. Minimize exposure to UV radiation to reduce your risk of skin cancer. Safety Always wear your seat belt while driving or riding in a vehicle. Do not drive: If you have been drinking alcohol. Do not ride with someone who has been drinking. When you are tired or distracted. While  texting. If you have been using any mind-altering substances or drugs. Wear a helmet and other protective equipment during sports activities. If you have firearms in your house, make sure you follow all gun safety procedures. What's next? Visit your health care provider once a year for an annual wellness visit. Ask your health care provider how often you should have your eyes and teeth checked. Stay up to date on all vaccines. This information is not intended to replace advice given to you by your health care provider. Make sure you discuss any questions you have with your health care provider. Document Revised: 12/26/2020 Document Reviewed: 12/26/2020 Elsevier Patient Education  [place].

## 2022-08-20 NOTE — Assessment & Plan Note (Signed)
Continue management per GYN. 

## 2022-08-20 NOTE — Assessment & Plan Note (Signed)
Stable meloxicam as needed.  Will refill today.

## 2022-08-20 NOTE — Assessment & Plan Note (Signed)
Check B12 

## 2022-08-22 ENCOUNTER — Other Ambulatory Visit: Payer: Self-pay | Admitting: *Deleted

## 2022-08-22 MED ORDER — ATORVASTATIN CALCIUM 40 MG PO TABS: 40.0000 mg | ORAL_TABLET | Freq: Every day | ORAL | 1 refills | Status: DC

## 2022-08-22 NOTE — Progress Notes (Signed)
Please inform patient of the following:  Her vitamin D is little bit low.  Recommend she start 1000 2000 IUs once daily.  We can recheck this again in 3 to 6 months.  Her cholesterol level is elevated.  Would recommend starting a statin to lower her cholesterol numbers and reduce risk of heart attack and stroke.  Please send in prescription for Lipitor 40 mg daily if she is agreeable to start.  We should recheck this again in 6-12 months.  Her blood sugar is borderline elevated but stable compared to last year.  We can check this again in a year.  Her B12 is on the low side of normal.  She can take 1000 mcg daily and we can recheck this with next blood draw.  Everything else is stable and we can recheck in year.

## 2022-10-06 ENCOUNTER — Ambulatory Visit (INDEPENDENT_AMBULATORY_CARE_PROVIDER_SITE_OTHER): Payer: Medicare Other

## 2022-10-06 VITALS — BP 110/66 | HR 75 | Temp 98.7°F | Wt 148.6 lb

## 2022-10-06 DIAGNOSIS — Z0000 Encounter for general adult medical examination without abnormal findings: Secondary | ICD-10-CM

## 2022-10-06 NOTE — Progress Notes (Addendum)
Subjective:   Natalie [name] is a 77 y.o. female who presents for Medicare Annual (Subsequent) preventive examination.  Review of Systems     Cardiac Risk Factors include: advanced age (>16men, >36 women);dyslipidemia     Objective:    Today's Vitals   10/06/22 0912  BP: 110/66  Pulse: 75  Temp: 98.7 F (37.1 C)  SpO2: 98%  Weight: 148 lb 9.6 oz (67.4 kg)   Body mass index is 25.51 kg/m.     10/06/2022    9:21 AM 09/12/2021    1:14 PM 02/28/2019    2:07 PM 11/05/2016    9:02 AM  Advanced Directives  Does Patient Have a Medical Advance Directive? No No No No  Would patient like information on creating a medical advance directive? Yes (MAU/Ambulatory/Procedural Areas - Information given) Yes (MAU/Ambulatory/Procedural Areas - Information given) Yes (MAU/Ambulatory/Procedural Areas - Information given)     Current Medications (verified) Outpatient Encounter Medications as of 10/06/2022  Medication Sig   atorvastatin (LIPITOR) 40 MG tablet Take 1 tablet (40 mg total) by mouth daily.   cetirizine (ZYRTEC) 10 MG tablet Take 10 mg by mouth daily.   meloxicam (MOBIC) 15 MG tablet Take 1 tablet (15 mg total) by mouth daily. (Patient taking differently: Take 15 mg by mouth as needed.)   metoprolol succinate (TOPROL-XL) 25 MG 24 hr tablet Take 1 tablet (25 mg total) by mouth daily.   SUMAtriptan (IMITREX) 25 MG tablet TAKE 1 TABLET BY MOUTH AS NEEDED FOR MIGRAINE. MAY REPEAT IN 2 HOURS IF HEADACHE PERSISTS OR RECURS   SYNTHROID 75 MCG tablet Take 1 tablet (75 mcg total) by mouth daily before breakfast.   No facility-administered encounter medications on file as of 10/06/2022.    Allergies (verified) Patient has no known allergies.   History: Past Medical History:  Diagnosis Date   Thyroid disease    Goiter   Past Surgical History:  Procedure Laterality Date   BREAST EXCISIONAL BIOPSY Right    BREAST SURGERY     right lump   CATARACT EXTRACTION, BILATERAL     Dr  Bing Plume   thyroid     TUBAL LIGATION     Family History  Problem Relation Age of Onset   Colon polyps Father 22   Arthritis Other    Cancer Other        colon, prostate   Heart disease Mother        CHF, died age 52   Lung cancer Brother    Stomach cancer Neg Hx    Social History   Socioeconomic History   Marital status: Divorced    Spouse name: Not on file   Number of children: 2   Years of education: Not on file   Highest education level: Not on file  Occupational History   Not on file  Tobacco Use   Smoking status: Never   Smokeless tobacco: Never  Substance and Sexual Activity   Alcohol use: No   Drug use: No   Sexual activity: Not on file  Other Topics Concern   Not on file  Social History Narrative   Lives with husband, son and grandson.   Social Determinants of Health   Financial Resource Strain: Low Risk  (09/12/2021)   Overall Financial Resource Strain (CARDIA)    Difficulty of Paying Living Expenses: Not hard at all  Food Insecurity: No Food Insecurity (10/06/2022)   Hunger Vital Sign    Worried About Running Out of Food in  the Last Year: Never true    [place] in the Last Year: Never true  Transportation Needs: No Transportation Needs (10/06/2022)   PRAPARE - Hydrologist (Medical): No    Lack of Transportation (Non-Medical): No  Physical Activity: Inactive (10/06/2022)   Exercise Vital Sign    Days of Exercise per Week: 0 days    Minutes of Exercise per Session: 0 min  Stress: No Stress Concern Present (10/06/2022)   [place]    Feeling of Stress : Not at all  Social Connections: Moderately Isolated (10/06/2022)   Social Connection and Isolation Panel [NHANES]    Frequency of Communication with Friends and Family: Once a week    Frequency of Social Gatherings with Friends and Family: More than three times a week    Attends Religious Services: 1 to 4  times per year    Active Member of [employer] or Organizations: No    Attends Music therapist: Never    Marital Status: Divorced    Tobacco Counseling Counseling given: Not Answered   Clinical Intake:  Pre-visit preparation completed: Yes  Pain : No/denies pain     BMI - recorded: 25.51 Nutritional Status: BMI 25 -29 Overweight Nutritional Risks: None Diabetes: No  How often do you need to have someone help you when you read instructions, pamphlets, or other written materials from your doctor or pharmacy?: 1 - Never  Diabetic?no  Interpreter Needed?: No  Information entered by :: Charlott Rakes, LPN   Activities of Daily Living    10/06/2022    9:22 AM  In your present state of health, do you have any difficulty performing the following activities:  Hearing? 0  Vision? 0  Difficulty concentrating or making decisions? 0  Walking or climbing stairs? 0  Dressing or bathing? 0  Doing errands, shopping? 0  Preparing Food and eating ? N  Using the Toilet? N  In the past six months, have you accidently leaked urine? N  Do you have problems with loss of bowel control? N  Managing your Medications? N  Managing your Finances? N  Housekeeping or managing your Housekeeping? N    Patient Care Team: Vivi Barrack, MD as PCP - General (Family Medicine) Allyn Kenner, DO as Consulting Physician (Obstetrics and Gynecology)  Indicate any recent Medical Services you may have received from other than [facility] providers in the past year (date may be approximate).     Assessment:   This is a routine wellness examination for Natalie [name].  Hearing/Vision screen Hearing Screening - Comments:: Pt denies any hearing issues  Vision Screening - Comments:: Pt follows up with [facility] for annual eye exams   Dietary issues and exercise activities discussed: Current Exercise Habits: The patient does not participate in regular exercise at present   Goals Addressed              This Visit's Progress    Patient Stated       Lose weight weight        Depression Screen    10/06/2022    9:20 AM 08/20/2022    9:13 AM 11/14/2021    2:21 PM 09/12/2021    1:12 PM 08/15/2021    1:25 PM 04/18/2021    9:08 AM 08/13/2020   10:46 AM  PHQ 2/9 Scores  PHQ - 2 Score 0 0 0 0 0 0 0    Fall  Risk    10/06/2022    9:22 AM 08/20/2022    9:13 AM 06/26/2022    8:54 AM 11/14/2021    2:21 PM 09/12/2021    1:15 PM  [place] in the past year? 0 0 0 0 0  Number falls in past yr: 0 0 0 0 0  Injury with Fall? 0 0 0 0 0  Risk for fall due to : Impaired vision No Fall Risks No Fall Risks No Fall Risks   Follow up Falls prevention discussed  Falls evaluation completed  Falls prevention discussed    FALL RISK PREVENTION PERTAINING TO THE HOME:  Any stairs in or around the home? No  If so, are there any without handrails? No  Home free of loose throw rugs in walkways, pet beds, electrical cords, etc? Yes  Adequate lighting in your home to reduce risk of falls? Yes   ASSISTIVE DEVICES UTILIZED TO PREVENT FALLS:  Life alert? No  Use of a cane, walker or w/c? No  Grab bars in the bathroom? Yes  Shower chair or bench in shower? No  Elevated toilet seat or a handicapped toilet? No   TIMED UP AND GO:  Was the test performed? Yes .  Length of time to ambulate 10 feet: 10 sec.   Gait steady and fast without use of assistive device  Cognitive Function:        10/06/2022    9:23 AM 09/12/2021    1:16 PM  6CIT Screen  What Year? 0 points 0 points  What month? 0 points 0 points  What time? 0 points 0 points  Count back from 20 0 points 0 points  Months in reverse 0 points 0 points  Repeat phrase 0 points 0 points  Total Score 0 points 0 points    Immunizations Immunization History  Administered Date(s) Administered   Fluad Quad(high Dose 65+) 04/12/2019, 05/22/2020, 04/02/2021, 04/08/2022   Influenza Inj Mdck Quad With Preservative 04/13/2018, 04/14/2019    Influenza Split 04/24/2011, 04/27/2012   Influenza Whole 04/17/2009   Influenza, High Dose Seasonal PF 04/28/2013, 04/24/2015, 04/17/2016, 04/22/2017   Influenza,inj,Quad PF,6+ Mos 05/01/2014, 04/07/2018   Influenza-Unspecified 03/14/2021   PFIZER(Purple Top)SARS-COV-2 Vaccination 08/08/2019, 08/29/2019, 05/05/2020   Pneumococcal Conjugate-13 05/01/2014   Pneumococcal Polysaccharide-23 04/28/2013, 04/24/2015   Td 07/14/1997, 04/17/2009    TDAP status: Due, Education has been provided regarding the importance of this vaccine. Advised may receive this vaccine at local pharmacy or Health Dept. Aware to provide a copy of the vaccination record if obtained from local pharmacy or Health Dept. Verbalized acceptance and understanding.  Flu Vaccine status: Up to date  Pneumococcal vaccine status: Up to date  Covid-19 vaccine status: Completed vaccines  Qualifies for Shingles Vaccine? Yes   Zostavax completed No   Shingrix Completed?: No.    Education has been provided regarding the importance of this vaccine. Patient has been advised to call insurance company to determine out of pocket expense if they have not yet received this vaccine. Advised may also receive vaccine at local pharmacy or Health Dept. Verbalized acceptance and understanding.  Screening Tests Health Maintenance  Topic Date Due   Zoster Vaccines- Shingrix (1 of 2) Never done   Fecal DNA (Cologuard)  Never done   DTaP/Tdap/Td (3 - Tdap) 04/18/2019   Medicare Annual Wellness (AWV)  10/06/2023   Pneumonia Vaccine 25+ Years old  Completed   INFLUENZA VACCINE  Completed   DEXA SCAN  Completed  Hepatitis C Screening  Completed   HPV VACCINES  Aged Out   COLONOSCOPY (Pts 45-41yrs Insurance coverage will need to be confirmed)  Discontinued   COVID-19 Vaccine  Discontinued    Health Maintenance  Health Maintenance Due  Topic Date Due   Zoster Vaccines- Shingrix (1 of 2) Never done   Fecal DNA (Cologuard)  Never done    DTaP/Tdap/Td (3 - Tdap) 04/18/2019    Pt has cologuard at home   Mammogram status: Completed 01/27/22. Repeat every year  Bone Density status: Completed 01/27/22. Results reflect: Bone density results: OSTEOPOROSIS. Repeat every 2 years.   Additional Screening:  Hepatitis C Screening: Completed 08/18/17  Vision Screening: Recommended annual ophthalmology exams for early detection of glaucoma and other disorders of the eye. Is the patient up to date with their annual eye exam?  Yes  Who is the provider or what is the name of the office in which the patient attends annual eye exams? [facility]  If pt is not established with a provider, would they like to be referred to a provider to establish care? No .   Dental Screening: Recommended annual dental exams for proper oral hygiene  Community Resource Referral / Chronic Care Management: CRR required this visit?  No   CCM required this visit?  No      Plan:     I have personally reviewed and noted the following in the patient's chart:   Medical and social history Use of alcohol, tobacco or illicit drugs  Current medications and supplements including opioid prescriptions. Patient is not currently taking opioid prescriptions. Functional ability and status Nutritional status Physical activity Advanced directives List of other physicians Hospitalizations, surgeries, and ER visits in previous 12 months Vitals Screenings to include cognitive, depression, and falls Referrals and appointments  In addition, I have reviewed and discussed with patient certain preventive protocols, quality metrics, and best practice recommendations. A written personalized care plan for preventive services as well as general preventive health recommendations were provided to patient.     Willette Brace, LPN   [id number]   Nurse Notes: none

## 2022-10-06 NOTE — Patient Instructions (Signed)
Natalie [name] , Thank you for taking time to come for your Medicare Wellness Visit. I appreciate your ongoing commitment to your health goals. Please review the following plan we discussed and let me know if I can assist you in the future.   These are the goals we discussed:  Goals      Eat more fruits and vegetables     Check out  online nutrition programs as GumSearch.nl and http://vang.com/; fit77me; Look for foods with "whole" wheat; bran; oatmeal etc Shot at the farmer's markets in season for fresher choices  Watch for "hydrogenated" on the label of oils which are trans-fats.  Watch for "high fructose corn syrup" in snacks, yogurt or ketchup  Meats have less marbling; bright colored fruits and vegetables;  Canned; dump out liquid and wash vegetables. Be mindful of what we are eating  Portion control is essential to a health weight! Sit down; take a break and enjoy your meal; take smaller bites; put the fork down between bites;  It takes 20 minutes to get full; so check in with your fullness cues and stop eating when you start to fill full            Patient Stated     Lose weight         This is a list of the screening recommended for you and due dates:  Health Maintenance  Topic Date Due   Zoster (Shingles) Vaccine (1 of 2) Never done   Cologuard (Stool DNA test)  Never done   DTaP/Tdap/Td vaccine (3 - Tdap) 04/18/2019   Medicare Annual Wellness Visit  09/13/2022   Pneumonia Vaccine  Completed   Flu Shot  Completed   DEXA scan (bone density measurement)  Completed   Hepatitis C Screening: USPSTF Recommendation to screen - Ages 89-79 yo.  Completed   HPV Vaccine  Aged Out   Colon Cancer Screening  Discontinued   COVID-19 Vaccine  Discontinued    Advanced directives: Advance directive discussed with you today. I have provided a copy for you to complete at home and have notarized. Once this is complete please bring a copy in to our office so we can scan it into  your chart.  Conditions/risks identified: lose weight   Next appointment: Follow up in one year for your annual wellness visit    Preventive Care 65 Years and Older, Female Preventive care refers to lifestyle choices and visits with your health care provider that can promote health and wellness. What does preventive care include? A yearly physical exam. This is also called an annual well check. Dental exams once or twice a year. Routine eye exams. Ask your health care provider how often you should have your eyes checked. Personal lifestyle choices, including: Daily care of your teeth and gums. Regular physical activity. Eating a healthy diet. Avoiding tobacco and drug use. Limiting alcohol use. Practicing safe sex. Taking low-dose aspirin every day. Taking vitamin and mineral supplements as recommended by your health care provider. What happens during an annual well check? The services and screenings done by your health care provider during your annual well check will depend on your age, overall health, lifestyle risk factors, and family history of disease. Counseling  Your health care provider may ask you questions about your: Alcohol use. Tobacco use. Drug use. Emotional well-being. Home and relationship well-being. Sexual activity. Eating habits. History of falls. Memory and ability to understand (cognition). Work and work Statistician. Reproductive health. Screening  You may have  the following tests or measurements: Height, weight, and BMI. Blood pressure. Lipid and cholesterol levels. These may be checked every 5 years, or more frequently if you are over 67 years old. Skin check. Lung cancer screening. You may have this screening every year starting at age 32 if you have a 30-pack-year history of smoking and currently smoke or have quit within the past 15 years. Fecal occult blood test (FOBT) of the stool. You may have this test every year starting at age 71. Flexible  sigmoidoscopy or colonoscopy. You may have a sigmoidoscopy every 5 years or a colonoscopy every 10 years starting at age 57. Hepatitis C blood test. Hepatitis B blood test. Sexually transmitted disease (STD) testing. Diabetes screening. This is done by checking your blood sugar (glucose) after you have not eaten for a while (fasting). You may have this done every 1-3 years. Bone density scan. This is done to screen for osteoporosis. You may have this done starting at age 52. Mammogram. This may be done every 1-2 years. Talk to your health care provider about how often you should have regular mammograms. Talk with your health care provider about your test results, treatment options, and if necessary, the need for more tests. Vaccines  Your health care provider may recommend certain vaccines, such as: Influenza vaccine. This is recommended every year. Tetanus, diphtheria, and acellular pertussis (Tdap, Td) vaccine. You may need a Td booster every 10 years. Zoster vaccine. You may need this after age 39. Pneumococcal 13-valent conjugate (PCV13) vaccine. One dose is recommended after age 40. Pneumococcal polysaccharide (PPSV23) vaccine. One dose is recommended after age 30. Talk to your health care provider about which screenings and vaccines you need and how often you need them. This information is not intended to replace advice given to you by your health care provider. Make sure you discuss any questions you have with your health care provider. Document Released: 07/27/2015 Document Revised: 03/19/2016 Document Reviewed: 05/01/2015 Elsevier Interactive Patient Education  2017 [place] Prevention in the Home Falls can cause injuries. They can happen to people of all ages. There are many things you can do to make your home safe and to help prevent falls. What can I do on the outside of my home? Regularly fix the edges of walkways and driveways and fix any cracks. Remove anything that  might make you trip as you walk through a door, such as a raised step or threshold. Trim any bushes or trees on the path to your home. Use bright outdoor lighting. Clear any walking paths of anything that might make someone trip, such as rocks or tools. Regularly check to see if handrails are loose or broken. Make sure that both sides of any steps have handrails. Any raised decks and porches should have guardrails on the edges. Have any leaves, snow, or ice cleared regularly. Use sand or salt on walking paths during winter. Clean up any spills in your garage right away. This includes oil or grease spills. What can I do in the bathroom? Use night lights. Install grab bars by the toilet and in the tub and shower. Do not use towel bars as grab bars. Use non-skid mats or decals in the tub or shower. If you need to sit down in the shower, use a plastic, non-slip stool. Keep the floor dry. Clean up any water that spills on the floor as soon as it happens. Remove soap buildup in the tub or shower regularly. Attach bath mats  securely with double-sided non-slip rug tape. Do not have throw rugs and other things on the floor that can make you trip. What can I do in the bedroom? Use night lights. Make sure that you have a light by your bed that is easy to reach. Do not use any sheets or blankets that are too big for your bed. They should not hang down onto the floor. Have a firm chair that has side arms. You can use this for support while you get dressed. Do not have throw rugs and other things on the floor that can make you trip. What can I do in the kitchen? Clean up any spills right away. Avoid walking on wet floors. Keep items that you use a lot in easy-to-reach places. If you need to reach something above you, use a strong step stool that has a grab bar. Keep electrical cords out of the way. Do not use floor polish or wax that makes floors slippery. If you must use wax, use non-skid floor  wax. Do not have throw rugs and other things on the floor that can make you trip. What can I do with my stairs? Do not leave any items on the stairs. Make sure that there are handrails on both sides of the stairs and use them. Fix handrails that are broken or loose. Make sure that handrails are as long as the stairways. Check any carpeting to make sure that it is firmly attached to the stairs. Fix any carpet that is loose or worn. Avoid having throw rugs at the top or bottom of the stairs. If you do have throw rugs, attach them to the floor with carpet tape. Make sure that you have a light switch at the top of the stairs and the bottom of the stairs. If you do not have them, ask someone to add them for you. What else can I do to help prevent falls? Wear shoes that: Do not have high heels. Have rubber bottoms. Are comfortable and fit you well. Are closed at the toe. Do not wear sandals. If you use a stepladder: Make sure that it is fully opened. Do not climb a closed stepladder. Make sure that both sides of the stepladder are locked into place. Ask someone to hold it for you, if possible. Clearly mark and make sure that you can see: Any grab bars or handrails. First and last steps. Where the edge of each step is. Use tools that help you move around (mobility aids) if they are needed. These include: Canes. Walkers. Scooters. Crutches. Turn on the lights when you go into a dark area. Replace any light bulbs as soon as they burn out. Set up your furniture so you have a clear path. Avoid moving your furniture around. If any of your floors are uneven, fix them. If there are any pets around you, be aware of where they are. Review your medicines with your doctor. Some medicines can make you feel dizzy. This can increase your chance of falling. Ask your doctor what other things that you can do to help prevent falls. This information is not intended to replace advice given to you by your  health care provider. Make sure you discuss any questions you have with your health care provider. Document Released: 04/26/2009 Document Revised: 12/06/2015 Document Reviewed: 08/04/2014 Elsevier Interactive Patient Education  2017 [employer].

## 2022-10-06 NOTE — Addendum Note (Signed)
Addended by: Willette Brace on: 10/06/2022 09:47 AM   Modules accepted: Level of Service

## 2022-10-09 ENCOUNTER — Encounter: Payer: Self-pay | Admitting: Family

## 2022-10-09 ENCOUNTER — Ambulatory Visit (INDEPENDENT_AMBULATORY_CARE_PROVIDER_SITE_OTHER): Payer: Medicare Other | Admitting: Family

## 2022-10-09 VITALS — BP 129/73 | HR 73 | Temp 97.7°F | Ht 64.0 in | Wt 146.6 lb

## 2022-10-09 DIAGNOSIS — J301 Allergic rhinitis due to pollen: Secondary | ICD-10-CM

## 2022-10-09 NOTE — Progress Notes (Signed)
Patient ID: Natalie [name], female    DOB: 05-28-1946, 77 y.o.   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 Has tried zyrtec which does not help.     HPI:      Allergy sx:  Pt c/o Runny nose, cough and chest congestion, She denies any fever or sore throat, no sinus pain or pressure, no headaches or body aches, does not feel bad like when she has a virus.  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 Present for 2 weeks. Has tried zyrtec which does not help. Pt reports having worsening sx since her job received lilly's in and she has to work with them every day. States her sx ease off a little when she is not working, but still has them. Reports having occasional allergies in spring but usually the Zyrtec is enough.      Assessment & Plan:  1. Seasonal allergic rhinitis due to pollen - advised on starting Nasacort nasal spray, 1 squirt each side bid x 3d, then use qd, if sx worsen can increase to bid thru allergy season. Advised to also use saline nasal spray tid & prior to Nasacort. Drink plenty of water. Call back next week if sx are not improved.  Subjective:    Outpatient Medications Prior to Visit  Medication Sig Dispense Refill   atorvastatin (LIPITOR) 40 MG tablet Take 1 tablet (40 mg total) by mouth daily. 30 tablet 1   cetirizine (ZYRTEC) 10 MG tablet Take 10 mg by mouth daily.     meloxicam (MOBIC) 15 MG tablet Take 1 tablet (15 mg total) by mouth daily. (Patient taking differently: Take 15 mg by mouth as needed.) 30 tablet 0   metoprolol succinate (TOPROL-XL) 25 MG 24 hr tablet Take 1 tablet (25 mg total) by mouth daily. 90 tablet 3   SUMAtriptan (IMITREX) 25 MG tablet TAKE 1 TABLET BY MOUTH AS NEEDED FOR MIGRAINE. MAY REPEAT IN 2 HOURS IF HEADACHE PERSISTS OR RECURS 10 tablet 0   SYNTHROID 75 MCG tablet Take 1 tablet (75 mcg total) by mouth daily before breakfast.  90 tablet 3   No facility-administered medications prior to visit.   Past Medical History:  Diagnosis Date   Thyroid disease    Goiter   Past Surgical History:  Procedure Laterality Date   BREAST EXCISIONAL BIOPSY Right    BREAST SURGERY     right lump   CATARACT EXTRACTION, BILATERAL     Dr Bing Plume   thyroid     TUBAL LIGATION     No Known Allergies    Objective:    Physical Exam Vitals and nursing note reviewed.  Constitutional:      Appearance: Normal appearance. She is not ill-appearing.     Interventions: Face mask in place.  HENT:     Right Ear: Tympanic membrane and ear canal normal.     Left Ear: Tympanic membrane and ear canal normal.     Nose: Congestion and rhinorrhea present.     Right Sinus: No frontal sinus tenderness.     Left Sinus: No frontal sinus tenderness.     Mouth/Throat:     Mouth: Mucous membranes are moist.     Pharynx: No pharyngeal swelling, oropharyngeal exudate, posterior oropharyngeal erythema or uvula swelling.     Tonsils: No tonsillar exudate or tonsillar abscesses.  Cardiovascular:     Rate and Rhythm: Normal rate and regular rhythm.  Pulmonary:     Effort: Pulmonary effort is normal.     Breath sounds: Normal breath sounds.  Musculoskeletal:        General: Normal range of motion.  Lymphadenopathy:     Head:     Right side of head: No preauricular or posterior auricular adenopathy.     Left side of head: No preauricular or posterior auricular adenopathy.     Cervical: No cervical adenopathy.  Skin:    General: Skin is warm and dry.  Neurological:     Mental Status: She is alert.  Psychiatric:        Mood and Affect: Mood normal.        Behavior: Behavior normal.    BP 129/73 (BP Location: Left Arm, Patient Position: Sitting, Cuff Size: Normal)   Pulse 73   Temp 97.7 F (36.5 C) (Temporal)   Ht 5\' 4"  (1.626 m)   Wt 146 lb 9.6 oz (66.5 kg)   SpO2 99%   BMI 25.16 kg/m  Wt Readings from Last 3 Encounters:  10/09/22 146  lb 9.6 oz (66.5 kg)  10/06/22 148 lb 9.6 oz (67.4 kg)  08/20/22 146 lb (66.2 kg)       Jeanie Sewer, NP

## 2022-10-09 NOTE — Patient Instructions (Addendum)
It was very nice to see you today!   For your allergy symptoms, Follow the instructions below: Use Nasal saline spray (i.e.,Simply Saline or other generic) or a nasal saline lavage (i.e.,NeilMed, Neti Pot) to flush allergens, general disinfecting &  prior to using medicated nasal sprays. Start generic Nasacort nasal spray 1 spray each nostril twice a day for 3 days, then reduce to daily use. 2 sprays twice a day is ok for really bad symptoms for 1 week, then reduce to 1 spray twice a day or daily. May add over the counter antihistamines such as Xyzal (levocetirizine), Zyrtec (cetirizine), Claritin (loratadine), or Allegra (fexofenadine) daily as needed. May take twice a day if needed as long as it does not cause drowsiness or too much dryness in nose, mouth or throat. May also use Pataday or generic over the counter eye drops: 1 drop in each eye daily as needed for itchy/watery eyes.        PLEASE NOTE:  If you had any lab tests please let us know if you have not heard back within a few days. You may see your results on MyChart before we have a chance to review them but we will give you a call once they are reviewed by [place]. If we ordered any referrals today, please let us know if you have not heard from their office within the next week.

## 2022-10-22 DIAGNOSIS — K080 Exfoliation of teeth due to systemic causes: Secondary | ICD-10-CM | POA: Diagnosis not present

## 2022-11-14 ENCOUNTER — Ambulatory Visit: Payer: Medicare Other | Admitting: Internal Medicine

## 2022-11-14 ENCOUNTER — Encounter: Payer: Self-pay | Admitting: Internal Medicine

## 2022-11-14 ENCOUNTER — Telehealth: Payer: Self-pay | Admitting: Internal Medicine

## 2022-11-14 VITALS — BP 132/80 | HR 72 | Ht 64.0 in | Wt 147.0 lb

## 2022-11-14 DIAGNOSIS — E890 Postprocedural hypothyroidism: Secondary | ICD-10-CM

## 2022-11-14 DIAGNOSIS — M810 Age-related osteoporosis without current pathological fracture: Secondary | ICD-10-CM | POA: Diagnosis not present

## 2022-11-14 DIAGNOSIS — E559 Vitamin D deficiency, unspecified: Secondary | ICD-10-CM | POA: Diagnosis not present

## 2022-11-14 LAB — ALBUMIN: Albumin: 4.2 g/dL (ref 3.5–5.2)

## 2022-11-14 LAB — TSH: TSH: 0.86 u[IU]/mL (ref 0.35–5.50)

## 2022-11-14 LAB — MAGNESIUM: Magnesium: 2.1 mg/dL (ref 1.5–2.5)

## 2022-11-14 LAB — VITAMIN D 25 HYDROXY (VIT D DEFICIENCY, FRACTURES): VITD: 24.73 ng/mL — ABNORMAL LOW (ref 30.00–100.00)

## 2022-11-14 LAB — PHOSPHORUS: Phosphorus: 3.9 mg/dL (ref 2.3–4.6)

## 2022-11-14 NOTE — Patient Instructions (Addendum)
Start Calcium 500 mg daily  Consume an additional 2 servings a day of calcium rich foods ( low fat dairy/ green leafy vegetables)      24-Hour Urine Collection  You will be collecting your urine for a 24-hour period of time. Your timer starts with your first urine of the morning (For example - If you first pee at 9AM, your timer will start at 9AM) Throw away your first urine of the morning Collect your urine every time you pee for the next 24 hours STOP your urine collection 24 hours after you started the collection (For example - You would stop at 9AM the day after you started)

## 2022-11-14 NOTE — Progress Notes (Unsigned)
Name: Natalie [name]  MRN/ DOB: [id number], 05-12-1946    Age/ Sex: 77 y.o., female    PCP: Ardith Dark, MD   Reason for Endocrinology Evaluation: Osteoporosis/Hypothyroidism     Date of Initial Endocrinology Evaluation: 11/14/2022     HPI: Ms. Natalie [name] is a 77 y.o. female with a past medical history of Dyslipidemia, Hypothyroidism and Osteoporosis . The patient presented for initial endocrinology clinic visit on 11/14/2022 for consultative assistance with her Osteoporosis/Hypothyroidism.   Pt was diagnosed with osteoporosis: 01/2022 T score -4.4 at the distal radius     Menarche at age : 45 Menopausal at age : 45 Fracture Hx: no Hx of HRT: no FH of osteoporosis or hip fracture: no Prior Hx of anti-estrogenic therapy : Prior Hx of anti-resorptive therapy : She was on Alendronate for many years , last dose was ~ 20 yrs  Sister with hip fracture   She has been noted with normal calcium but low Vitamin D She is on Vitamin D 1000 iu daily  She is not on calcium because she was told her calcium was borderline  She eats a lot of salad, drinks almond milk twice a week. Eats cheese 4-5 x a week. Does not eat Yogurt     Thyroid History : She is S/P subtotal thyroidotomy > 50 yrs ago due to goiter.  She is on Synthroid 75 mcg   Pt endorses thyroid nodule for years  Has occasional palpitations  Denies diarrhea  Denies tremors  Denies heartburn  She is c/o weight gain and bags under the eyes   HISTORY:  Past Medical History:  Past Medical History:  Diagnosis Date   Thyroid disease    Goiter   Past Surgical History:  Past Surgical History:  Procedure Laterality Date   BREAST EXCISIONAL BIOPSY Right    BREAST SURGERY     right lump   CATARACT EXTRACTION, BILATERAL     Dr Hazle Quant   thyroid     TUBAL LIGATION      Social History:  reports that she has never smoked. She has never used smokeless tobacco. She reports that she does not drink alcohol and does  not use drugs. Family History: family history includes Arthritis in an other family member; Cancer in an other family member; Colon polyps (age of onset: 37) in her father; Heart disease in her mother; Lung cancer in her brother.   HOME MEDICATIONS: Allergies as of 11/14/2022   No Known Allergies      Medication List        Accurate as of Nov 14, 2022 10:33 AM. If you have any questions, ask your nurse or doctor.          atorvastatin 40 MG tablet Commonly known as: LIPITOR Take 1 tablet (40 mg total) by mouth daily.   cetirizine 10 MG tablet Commonly known as: ZYRTEC Take 10 mg by mouth daily.   meloxicam 15 MG tablet Commonly known as: MOBIC Take 1 tablet (15 mg total) by mouth daily. What changed:  when to take this reasons to take this   metoprolol succinate 25 MG 24 hr tablet Commonly known as: TOPROL-XL Take 1 tablet (25 mg total) by mouth daily.   SUMAtriptan 25 MG tablet Commonly known as: IMITREX TAKE 1 TABLET BY MOUTH AS NEEDED FOR MIGRAINE. MAY REPEAT IN 2 HOURS IF HEADACHE PERSISTS OR RECURS   Synthroid 75 MCG tablet Generic drug: levothyroxine Take 1 tablet (75 mcg total)  by mouth daily before breakfast.          REVIEW OF SYSTEMS: A comprehensive ROS was conducted with the patient and is negative except as per HPI    OBJECTIVE:  VS: BP 132/80 (BP Location: Left Arm, Patient Position: Sitting, Cuff Size: Small)   Pulse 72   Ht 5\' 4"  (1.626 m)   Wt 147 lb (66.7 kg)   SpO2 96%   BMI 25.23 kg/m    Wt Readings from Last 3 Encounters:  11/14/22 147 lb (66.7 kg)  10/09/22 146 lb 9.6 oz (66.5 kg)  10/06/22 148 lb 9.6 oz (67.4 kg)     EXAM: General: Pt appears well and is in NAD  Neck: General: Supple without adenopathy. Thyroid: Left thyroid asymmetry is noted   Lungs: Clear with good BS bilat  Heart: Auscultation: RRR.  Abdomen: soft, nontender  Extremities:  BL LE: No pretibial edema normal   Mental Status: Judgment, insight:  Intact Orientation: Oriented to time, place, and person Mood and affect: No depression, anxiety, or agitation     DATA REVIEWED: ***    ASSESSMENT/PLAN/RECOMMENDATIONS:   Osteoporosis :    2. Postoperative Hypothyroidism    Medications :  Signed electronically by: Lyndle Herrlich, MD  [facility]  [facility] [address] [place] [place], [place] [address] Phone: [phone] FAX: [phone]   CC: Ardith Dark, MD [address] [place] [address] Phone: [phone] Fax: [phone]   Return to Endocrinology clinic as below: Future Appointments  Date Time Provider Department Center  10/12/2023  9:00 AM [facility] ANNUAL WELLNESS VISIT 1 [facility]

## 2022-11-14 NOTE — Telephone Encounter (Signed)
Hi Brandy,   Can you please add this pt to the [facility] List ?    Thanks

## 2022-11-14 NOTE — Telephone Encounter (Signed)
Evenity VOB initiated via [email or web address]  New start  

## 2022-11-17 ENCOUNTER — Other Ambulatory Visit: Payer: Medicare Other

## 2022-11-17 DIAGNOSIS — M810 Age-related osteoporosis without current pathological fracture: Secondary | ICD-10-CM | POA: Diagnosis not present

## 2022-11-17 LAB — PTH, INTACT AND CALCIUM
Calcium: 9.6 mg/dL (ref 8.6–10.4)
PTH: 20 pg/mL (ref 16–77)

## 2022-11-17 LAB — CALCIUM, IONIZED: Calcium, Ion: 5.1 mg/dL (ref 4.7–5.5)

## 2022-11-17 NOTE — Progress Notes (Signed)
Start date 11/16/2022 End date 11/17/2022 Start time 802am End time 802 am Total volume 

## 2022-11-18 LAB — CALCIUM, URINE, 24 HOUR: Calcium, 24H Urine: 127 mg/24 h

## 2022-11-18 NOTE — Telephone Encounter (Signed)
Prior Auth required for EVENITY  PA PROCESS DETAILS: PA is required. Providers may call Medical Utilization at [phone] to initiate. Forms may be accessed online at [email or web address]  

## 2022-11-21 NOTE — Telephone Encounter (Signed)
Prior auth initiated for [facility] via [employer] portal  Case ID: [id number]

## 2022-11-22 LAB — CORTISOL, URINE, 24 HOUR
24 Hour urine volume (VMAHVA): 2400 mL
CREATININE, URINE: 0.74 g/(24.h) (ref 0.50–2.15)
Cortisol (Ur), Free: 19.2 mcg/24 h (ref 4.0–50.0)

## 2022-11-27 NOTE — Telephone Encounter (Signed)
Request re-submitted clarifying that auth should be run for medical benefits.   Case ID: [id number]

## 2022-11-28 NOTE — Telephone Encounter (Deleted)
Notes Internal Notes: 24-05-12 17:21 [[id number] .[email or web address]] Request received for member with active  Medicare benefits with the Plan. This medication does not require Part B Step or PA Medical Review.  Thus, no review is required. 24-05-12 17:21 [[id number] .[email or web address]] Request is for medical review, this medication does not  require medical review. Also, verified no pharmacy claims for this medication.

## 2022-12-01 ENCOUNTER — Encounter: Payer: Self-pay | Admitting: Family Medicine

## 2022-12-01 ENCOUNTER — Ambulatory Visit (INDEPENDENT_AMBULATORY_CARE_PROVIDER_SITE_OTHER): Payer: Medicare Other | Admitting: Family Medicine

## 2022-12-01 VITALS — BP 145/82 | HR 77 | Temp 97.8°F | Ht 64.0 in | Wt 148.0 lb

## 2022-12-01 DIAGNOSIS — G43101 Migraine with aura, not intractable, with status migrainosus: Secondary | ICD-10-CM | POA: Diagnosis not present

## 2022-12-01 DIAGNOSIS — J309 Allergic rhinitis, unspecified: Secondary | ICD-10-CM

## 2022-12-01 DIAGNOSIS — M810 Age-related osteoporosis without current pathological fracture: Secondary | ICD-10-CM

## 2022-12-01 DIAGNOSIS — E039 Hypothyroidism, unspecified: Secondary | ICD-10-CM

## 2022-12-01 MED ORDER — BENZONATATE 200 MG PO CAPS: 200.0000 mg | ORAL_CAPSULE | Freq: Two times a day (BID) | ORAL | 0 refills | Status: DC | PRN

## 2022-12-01 MED ORDER — AZELASTINE HCL 0.1 % NA SOLN: 2.0000 | Freq: Two times a day (BID) | NASAL | 12 refills | Status: DC

## 2022-12-01 MED ORDER — AMOXICILLIN-POT CLAVULANATE 875-125 MG PO TABS: 1.0000 | ORAL_TABLET | Freq: Two times a day (BID) | ORAL | 0 refills | Status: DC

## 2022-12-01 NOTE — Progress Notes (Signed)
   Natalie [name] is a 77 y.o. female who presents today for an office visit.  Assessment/Plan:  New/Acute Problems: Sinusitis  No red flags.  Start Astelin nasal spray.  Will send pocket prescription for Augmentin with instruction to not start unless symptoms do not improve the next few days.  Will send in Tessalon for cough.  Encouraged hydration.  She can use over-the-counter meds as needed as well.  We discussed reasons to return to care.  Follow-up as needed.  Chronic Problems Addressed Today: Hypothyroidism Last TSH at goal.  Continue Synthroid 75 mcg daily.  Allergic rhinitis Uses Zyrtec as needed.  Will be adding on Astelin as above.  Osteoporosis Has established with endocrinology.  She is working on weightbearing exercises and taking her calcium and vitamin D supplementation.  Needs repeat DEXA scan next year.     Subjective:  HPI:  See Assessment / plan for status chronic conditions.  Main concern today is sinus infection. Started last week. She is having facial pain and pressure. Some headache. A lot of cough and congestion. No fevers or chills. No treatments tried.  No vision changes.        Objective:  Physical Exam: BP (!) 145/82   Pulse 77   Temp 97.8 F (36.6 C) (Temporal)   Ht 5\' 4"  (1.626 m)   Wt 148 lb (67.1 kg)   SpO2 98%   BMI 25.40 kg/m   Gen: No acute distress, resting comfortably HEENT: TMs with clear effusion.  OP erythematous.  Nasal mucosa erythematous and boggy bilaterally. CV: Regular rate and rhythm with no murmurs appreciated Pulm: Normal work of breathing, clear to auscultation bilaterally with no crackles, wheezes, or rhonchi Neuro: Grossly normal, moves all extremities Psych: Normal affect and thought content      [name] M. Jimmey Ralph, MD 12/01/2022 2:51 PM

## 2022-12-01 NOTE — Assessment & Plan Note (Signed)
Uses Zyrtec as needed.  Will be adding on Astelin as above.

## 2022-12-01 NOTE — Patient Instructions (Signed)
It was very nice to see you today!  You have a sinus infection.  Please start the nasal spray.  Start the antibiotic if not improving.  Use the cough medication as needed.  Return if symptoms worsen or fail to improve.   Take care, Dr Jimmey Ralph  PLEASE NOTE:  If you had any lab tests, please let us know if you have not heard back within a few days. You may see your results on mychart before we have a chance to review them but we will give you a call once they are reviewed by [place].   If we ordered any referrals today, please let us know if you have not heard from their office within the next week.   If you had any urgent prescriptions sent in today, please check with the pharmacy within an hour of our visit to make sure the prescription was transmitted appropriately.   Please try these tips to maintain a healthy lifestyle:  Eat at least 3 REAL meals and 1-2 snacks per day.  Aim for no more than 5 hours between eating.  If you eat breakfast, please do so within one hour of getting up.   Each meal should contain half fruits/vegetables, one quarter protein, and one quarter carbs (no bigger than a computer mouse)  Cut down on sweet beverages. This includes juice, soda, and sweet tea.   Drink at least 1 glass of water with each meal and aim for at least 8 glasses per day  Exercise at least 150 minutes every week.

## 2022-12-01 NOTE — Assessment & Plan Note (Signed)
Has established with endocrinology.  She is working on weightbearing exercises and taking her calcium and vitamin D supplementation.  Needs repeat DEXA scan next year.

## 2022-12-01 NOTE — Assessment & Plan Note (Signed)
Last TSH at goal.  Continue Synthroid 75 mcg daily. 

## 2022-12-02 NOTE — Telephone Encounter (Addendum)
DENIED under medical benefits.   MESSAGE: Hello,  We received this request for authorization of coverage for a medication. However, this request has already  been reviewed, and the medication was denied for coverage on 11/24/2022. A new coverage determination  cannot be requested until day 61 following the previous denial decision; you also have the option to appeal  the decision (Fax# [phone]). Please contact us at [phone] if you have any additional  questions. Thank you.  Ivette Loyal , [facility]

## 2022-12-04 ENCOUNTER — Telehealth: Payer: Self-pay | Admitting: Family Medicine

## 2022-12-04 NOTE — Telephone Encounter (Signed)
Pt states her cough is getting worse and the perls are not working, she would like a cough syrup or something of the sort. Please advise.

## 2022-12-05 MED ORDER — PROMETHAZINE-DM 6.25-15 MG/5ML PO SYRP
5.0000 mL | ORAL_SOLUTION | Freq: Four times a day (QID) | ORAL | 0 refills | Status: DC | PRN
Start: 2022-12-05 — End: 2023-03-23

## 2022-12-05 NOTE — Telephone Encounter (Signed)
Please advise 

## 2022-12-09 NOTE — Telephone Encounter (Addendum)
Procedure Details close Code J3111  Type [facility]  Description INJ. ROMOSOZUMAB-AQQG 1 MG  ! Alerts 1 of 1 Effective 12/28/2019 this code requires review in [facility]

## 2022-12-09 NOTE — Telephone Encounter (Addendum)
Prior auth initiated via [facility] provider portal. Reference # [id number]

## 2022-12-12 NOTE — Telephone Encounter (Signed)
Pending review

## 2022-12-16 NOTE — Telephone Encounter (Signed)
APPROVED PA# [id number]  Valid: 12/09/22-12/09/23

## 2022-12-18 NOTE — Telephone Encounter (Signed)
Pt ready for scheduling on or after 12/18/22  Out-of-pocket cost due at time of visit: $450  Primary: [employer] [place] Medicare Adv HMO-POS Evenity co-insurance: 20% (approximately $450) Admin fee co-insurance: 0%   Deductible: does not apply  APPROVED PA# [id number]  Valid: 12/09/22-12/09/23  Secondary: N/A Evenity co-insurance:  Admin fee co-insurance:  Deductible:  Prior Auth:  PA# Valid:   ** This summary of benefits is an estimation of the patient's out-of-pocket cost. Exact cost may vary based on individual plan coverage.

## 2022-12-19 NOTE — Telephone Encounter (Signed)
LMx1 on patient's voice mail to call office to schedule Evenity injection.

## 2022-12-22 NOTE — Telephone Encounter (Signed)
LMx1 to schedule Evenity injection.

## 2022-12-23 NOTE — Telephone Encounter (Signed)
LMx1 to call office to schedule next Evenity injection.

## 2023-03-09 DIAGNOSIS — L988 Other specified disorders of the skin and subcutaneous tissue: Secondary | ICD-10-CM | POA: Diagnosis not present

## 2023-03-09 DIAGNOSIS — L738 Other specified follicular disorders: Secondary | ICD-10-CM | POA: Diagnosis not present

## 2023-03-09 DIAGNOSIS — L309 Dermatitis, unspecified: Secondary | ICD-10-CM | POA: Diagnosis not present

## 2023-03-23 ENCOUNTER — Other Ambulatory Visit: Payer: Self-pay | Admitting: Family Medicine

## 2023-04-17 ENCOUNTER — Other Ambulatory Visit: Payer: Self-pay | Admitting: Family Medicine

## 2023-04-17 DIAGNOSIS — Z1212 Encounter for screening for malignant neoplasm of rectum: Secondary | ICD-10-CM

## 2023-04-17 DIAGNOSIS — Z1211 Encounter for screening for malignant neoplasm of colon: Secondary | ICD-10-CM

## 2023-04-22 ENCOUNTER — Ambulatory Visit (INDEPENDENT_AMBULATORY_CARE_PROVIDER_SITE_OTHER): Payer: Medicare Other | Admitting: *Deleted

## 2023-04-22 DIAGNOSIS — Z23 Encounter for immunization: Secondary | ICD-10-CM | POA: Diagnosis not present

## 2023-05-08 DIAGNOSIS — K080 Exfoliation of teeth due to systemic causes: Secondary | ICD-10-CM | POA: Diagnosis not present

## 2023-05-18 ENCOUNTER — Encounter: Payer: Self-pay | Admitting: Internal Medicine

## 2023-05-18 ENCOUNTER — Ambulatory Visit (INDEPENDENT_AMBULATORY_CARE_PROVIDER_SITE_OTHER): Payer: Medicare Other | Admitting: Internal Medicine

## 2023-05-18 VITALS — BP 120/80 | HR 67 | Ht 64.0 in | Wt 146.8 lb

## 2023-05-18 DIAGNOSIS — H26493 Other secondary cataract, bilateral: Secondary | ICD-10-CM | POA: Diagnosis not present

## 2023-05-18 DIAGNOSIS — E010 Iodine-deficiency related diffuse (endemic) goiter: Secondary | ICD-10-CM

## 2023-05-18 DIAGNOSIS — H43813 Vitreous degeneration, bilateral: Secondary | ICD-10-CM | POA: Diagnosis not present

## 2023-05-18 DIAGNOSIS — E890 Postprocedural hypothyroidism: Secondary | ICD-10-CM | POA: Diagnosis not present

## 2023-05-18 DIAGNOSIS — M810 Age-related osteoporosis without current pathological fracture: Secondary | ICD-10-CM

## 2023-05-18 DIAGNOSIS — H524 Presbyopia: Secondary | ICD-10-CM | POA: Diagnosis not present

## 2023-05-18 LAB — VITAMIN D 25 HYDROXY (VIT D DEFICIENCY, FRACTURES): VITD: 25.42 ng/mL — ABNORMAL LOW (ref 30.00–100.00)

## 2023-05-18 LAB — TSH: TSH: 0.38 u[IU]/mL (ref 0.35–5.50)

## 2023-05-18 NOTE — Patient Instructions (Addendum)
Check out Prolia or Forteo for osteoporosis treatment    Please add over-the-counter calcium carbonate 500 mg, 1 tablet daily in addition to your regular low-fat dairy  Continue vitamin D 2000 daily   Please work on weight bearing exercises

## 2023-05-18 NOTE — Progress Notes (Unsigned)
Name: Natalie [name]  MRN/ DOB: [id number], 05-14-1946    Age/ Sex: 77 y.o., female    PCP: Ardith Dark, MD   Reason for Endocrinology Evaluation: Osteoporosis/Hypothyroidism     Date of Initial Endocrinology Evaluation: 11/14/2022    HPI: Ms. Natalie [name] is a 77 y.o. female with a past medical history of Dyslipidemia, Hypothyroidism and Osteoporosis . The patient presented for initial endocrinology clinic visit on 11/14/2022 for consultative assistance with her Osteoporosis/Hypothyroidism.   Pt was diagnosed with osteoporosis: 01/2022 T score -4.4 at the distal radius     Menarche at age : 54 Menopausal at age : 54 Fracture Hx: no Hx of HRT: no FH of osteoporosis or hip fracture: no Prior Hx of anti-estrogenic therapy : Prior Hx of anti-resorptive therapy : She was on Alendronate for many years , last dose was ~ 34 yrs  Sister with hip fracture   24-hour urinary calcium excretion 127 Mg 11/2022     Thyroid History : She is S/P subtotal thyroidotomy > 50 yrs ago due to goiter.  She is on Synthroid 75 mcg   Pt endorses thyroid nodule for years   SUBJECTIVE:    Today (05/18/23):  Natalie [name] is here for follow-up on osteoporosis and hypothyroid.   Denies local neck swelling  Denies  palpitations  Denies diarrhea or constipation  Denies heartburn   She has not been working on weigh bearing exercise.   Calcium 500 mg daily- not taking  Vitamin D 2000 international unit  daily  Synthroid 75 mcg daily   HISTORY:  Past Medical History:  Past Medical History:  Diagnosis Date   Thyroid disease    Goiter   Past Surgical History:  Past Surgical History:  Procedure Laterality Date   BREAST EXCISIONAL BIOPSY Right    BREAST SURGERY     right lump   CATARACT EXTRACTION, BILATERAL     Dr Hazle Quant   thyroid     TUBAL LIGATION      Social History:  reports that she has never smoked. She has never used smokeless tobacco. She reports that she  does not drink alcohol and does not use drugs. Family History: family history includes Arthritis in an other family member; Cancer in an other family member; Colon polyps (age of onset: 79) in her father; Heart disease in her mother; Lung cancer in her brother.   HOME MEDICATIONS: Allergies as of 05/18/2023   No Known Allergies      Medication List        Accurate as of May 18, 2023 11:51 AM. If you have any questions, ask your nurse or doctor.          amoxicillin-clavulanate 875-125 MG tablet Commonly known as: AUGMENTIN Take 1 tablet by mouth 2 (two) times daily.   atorvastatin 40 MG tablet Commonly known as: LIPITOR Take 1 tablet (40 mg total) by mouth daily.   azelastine 0.1 % nasal spray Commonly known as: ASTELIN Place 2 sprays into both nostrils 2 (two) times daily.   benzonatate 200 MG capsule Commonly known as: TESSALON Take 1 capsule (200 mg total) by mouth 2 (two) times daily as needed for cough.   cetirizine 10 MG tablet Commonly known as: ZYRTEC Take 10 mg by mouth daily.   meloxicam 15 MG tablet Commonly known as: MOBIC Take 1 tablet (15 mg total) by mouth daily. What changed:  when to take this reasons to take this   metoprolol succinate  25 MG 24 hr tablet Commonly known as: TOPROL-XL Take 1 tablet (25 mg total) by mouth daily.   promethazine-dextromethorphan 6.25-15 MG/5ML syrup Commonly known as: PROMETHAZINE-DM TAKE 5 ML BY MOUTH  4 TIMES DAILY AS NEEDED   SUMAtriptan 25 MG tablet Commonly known as: IMITREX TAKE 1 TABLET BY MOUTH AS NEEDED FOR MIGRAINE. MAY REPEAT IN 2 HOURS IF HEADACHE PERSISTS OR RECURS   Synthroid 75 MCG tablet Generic drug: levothyroxine Take 1 tablet (75 mcg total) by mouth daily before breakfast.   tretinoin 0.1 % cream Commonly known as: RETIN-A APPLY A PEA SIZE OF CREAM TOPICALLY TO FACE ONCE DAILY IN THE EVENING FOR 30 DAYS for 30   triamcinolone cream 0.1 % Commonly known as: KENALOG 1 application  Externally Twice a day for 14 days          REVIEW OF SYSTEMS: A comprehensive ROS was conducted with the patient and is negative except as per HPI    OBJECTIVE:  VS: BP 120/80 (BP Location: Left Arm, Patient Position: Sitting, Cuff Size: Small)   Pulse 67   Ht 5\' 4"  (1.626 m)   Wt 146 lb 12.8 oz (66.6 kg)   SpO2 99%   BMI 25.20 kg/m    Wt Readings from Last 3 Encounters:  05/18/23 146 lb 12.8 oz (66.6 kg)  12/01/22 148 lb (67.1 kg)  11/14/22 147 lb (66.7 kg)     EXAM: General: Pt appears well and is in NAD  Neck: General: Supple without adenopathy. Thyroid: Left thyroid asymmetry is noted   Lungs: Clear with good BS bilat  Heart: Auscultation: RRR.  Abdomen: soft, nontender  Extremities:  BL LE: No pretibial edema normal   Mental Status: Judgment, insight: Intact Orientation: Oriented to time, place, and person Mood and affect: No depression, anxiety, or agitation     DATA REVIEWED:     Latest Reference Range & Units 11/14/22 11:18  Calcium 8.6 - 10.4 mg/dL 9.6  Calcium Ionized 4.7 - 5.5 mg/dL 5.1  Phosphorus 2.3 - 4.6 mg/dL 3.9  Magnesium 1.5 - 2.5 mg/dL 2.1  Albumin 3.5 - 5.2 g/dL 4.2  VITD 16.10 - 960.45 ng/mL 24.73 (L)  PTH, Intact 16 - 77 pg/mL 20  TSH 0.35 - 5.50 uIU/mL 0.86   DXA 01/27/2022  ASSESSMENT: The BMD measured at Forearm Radius 33% is 0.495 g/cm2 with a T-score of -4.4.   Site Region Measured Date Measured Age YA BMD Significant CHANGE T-score Left Forearm Radius 33% 01/27/2022 74.3 -4.4 0.495 g/cm2   AP Spine L1-L4 (L3) 01/27/2022 74.3 -0.5 1.110 g/cm2 AP Spine L1-L4 (L3) 09/13/2018 70.9 -0.6 1.105 g/cm2   DualFemur Total Right 01/27/2022 74.3 -1.6 0.808 g/cm2 DualFemur Total Right 09/13/2018 70.9 -1.5 0.824 g/cm2   DualFemur Total Mean 01/27/2022 74.3 -1.3 0.838 g/cm2 DualFemur Total Mean 09/13/2018 70.9 -1.3 0.841 g/cm2   ASSESSMENT/PLAN/RECOMMENDATIONS:   Osteoporosis :  -Patient with severe osteoporosis -We discussed  the importance of optimizing calcium and vitamin D intake.  Somehow she is on vitamin C instead of calcium.  Today we discussed the difference between vitamin C and calcium tablets and I have encouraged her to obtain the right calcium carbonate tablets in addition to her regular low-fat dairy -Emphasized the importance of weightbearing exercises -She was approved for Evenity, but declined starting due to skepticism regarding side effects -Today we also discussed PTH analogs as well as Prolia, patient would like to do her own research     2. Postoperative Hypothyroidism  - TSH****  Medications : Continue Synthroid 75 mcg daily   3.  Vitamin D insufficiency:   -Increase vitamin D 2000 IU daily  4.  Thyromegaly  -Patient with left thyroid lobe asymmetry -Thyroid ultrasound 2017 did not reveal any nodules, will proceed with repeat thyroid ultrasound   Follow-up pending ultrasound results    Signed electronically by: Lyndle Herrlich, MD  [facility] Endocrinology  [facility] [address] [place] [place], [place] [address] Phone: [phone] FAX: [phone]   CC: Ardith Dark, MD [address] [place] [address] Phone: [phone] Fax: [phone]   Return to Endocrinology clinic as below: Future Appointments  Date Time Provider Department Center  10/12/2023  9:00 AM [facility] ANNUAL WELLNESS VISIT 1 [facility]

## 2023-05-19 LAB — BASIC METABOLIC PANEL
BUN: 18 mg/dL (ref 6–23)
CO2: 26 meq/L (ref 19–32)
Calcium: 9.4 mg/dL (ref 8.4–10.5)
Chloride: 102 meq/L (ref 96–112)
Creatinine, Ser: 0.78 mg/dL (ref 0.40–1.20)
GFR: 74.17 mL/min (ref >60.00)
Glucose, Bld: 87 mg/dL (ref 70–99)
Potassium: 4.5 meq/L (ref 3.5–5.1)
Sodium: 139 meq/L (ref 135–145)

## 2023-05-19 LAB — PARATHYROID HORMONE, INTACT (NO CA): PTH: 22 pg/mL (ref 16–77)

## 2023-05-20 NOTE — Telephone Encounter (Signed)
Per visit note 05/18/23:  ASSESSMENT/PLAN/RECOMMENDATIONS:    Osteoporosis :   -Patient with severe osteoporosis -We discussed the importance of optimizing calcium and vitamin D intake.  Somehow she is on vitamin C instead of calcium.  Today we discussed the difference between vitamin C and calcium tablets and I have encouraged her to obtain the right calcium carbonate tablets in addition to her regular low-fat dairy -Emphasized the importance of weightbearing exercises -She was approved for Evenity, but declined starting due to skepticism regarding side effects -Today we also discussed PTH analogs as well as Prolia, patient would like to do her own research

## 2023-07-29 ENCOUNTER — Ambulatory Visit (INDEPENDENT_AMBULATORY_CARE_PROVIDER_SITE_OTHER): Payer: Medicare Other | Admitting: Family Medicine

## 2023-07-29 ENCOUNTER — Encounter: Payer: Self-pay | Admitting: Family Medicine

## 2023-07-29 VITALS — BP 140/88 | HR 71 | Temp 96.6°F | Ht 64.0 in | Wt 146.8 lb

## 2023-07-29 DIAGNOSIS — M1990 Unspecified osteoarthritis, unspecified site: Secondary | ICD-10-CM

## 2023-07-29 DIAGNOSIS — R739 Hyperglycemia, unspecified: Secondary | ICD-10-CM | POA: Diagnosis not present

## 2023-07-29 DIAGNOSIS — E785 Hyperlipidemia, unspecified: Secondary | ICD-10-CM | POA: Diagnosis not present

## 2023-07-29 MED ORDER — MELOXICAM 15 MG PO TABS: 15.0000 mg | ORAL_TABLET | Freq: Every day | ORAL | 0 refills | Status: DC

## 2023-07-29 NOTE — Progress Notes (Signed)
   Natalie [name] is a 78 y.o. female who presents today for an office visit.  Assessment/Plan:  New/Acute Problems: Right Shoulder Pain  She has multiple areas of tenderness on exam today.  Likely does have some underlying degenerative changes though may have some rotator cuff tendinopathy as well.  She has had good improvement with meloxicam  for the last couple of days.  Will continue this for another 1 to 2 weeks.  Also discussed home exercise program and handout was given.  She can use ice as needed as well.  She will let us  know if not improving in the next couple of weeks and we can refer to PT or sports medicine at that time.  Chronic Problems Addressed Today: Osteoarthritis Likely contributing to above right shoulder pain.  Will refill meloxicam  today.  This continues to be an issue we can refer to sports medicine.  Dyslipidemia she is coming back soon for annual physical.  Doing well with Lipitor 40 mg daily.  Will check lipids at next physical.  Hyperglycemia She is working on lots interventions.  She will come back for A1c check at her CPE in a few weeks.     Subjective:  HPI:  See Assessment / plan for status of chronic conditions.  She is here today with right shoulder pain. Started several weeks ago. No obvious injuries or precipitating events.  She does use her arms a lot with her work.  Symptoms were manageable until few days ago but became severe.  She started taking meloxicam .  Symptoms have improved the last couple of days.  No weakness or numbness.  No tingling.       Objective:  Physical Exam: BP (!) 140/88   Pulse 71   Temp (!) 96.6 F (35.9 C) (Temporal)   Ht 5\' 4"  (1.626 m)   Wt 146 lb 12.8 oz (66.6 kg)   SpO2 98%   BMI 25.20 kg/m   Gen: No acute distress, resting comfortably CV: Regular rate and rhythm with no murmurs appreciated Pulm: Normal work of breathing, clear to auscultation bilaterally with no crackles, wheezes, or  rhonchi MUSCULOSKELETAL - Shoulder: No deformities.  Tender to palpation along anterior and lateral edge.  Pain elicited with resisted supraspinatus testing, internal rotation, and external rotation.  Biceps had tender to palpation.  Pain elicited with palpation of AC joint.  Palpable click noted with passive and active range of motion.  Neuro vastly intact distally. Neuro: Grossly normal, moves all extremities Psych: Normal affect and thought content      Camillia Marcy M. Daneil Dunker, MD 07/29/2023 10:36 AM

## 2023-07-29 NOTE — Assessment & Plan Note (Signed)
 Likely contributing to above right shoulder pain.  Will refill meloxicam  today.  This continues to be an issue we can refer to sports medicine.

## 2023-07-29 NOTE — Assessment & Plan Note (Signed)
 She is working on lots interventions.  She will come back for A1c check at her CPE in a few weeks.

## 2023-07-29 NOTE — Assessment & Plan Note (Signed)
 she is coming back soon for annual physical.  Doing well with Lipitor 40 mg daily.  Will check lipids at next physical.

## 2023-07-29 NOTE — Patient Instructions (Signed)
 It was very nice to see you today!  You had a flareup of arthritis in your shoulder.  Start meloxicam  and work on the exercises.  Let me know if not proving.  Return if symptoms worsen or fail to improve.   Take care, Dr Daneil Dunker  PLEASE NOTE:  If you had any lab tests, please let us  know if you have not heard back within a few days. You may see your results on mychart before we have a chance to review them but we will give you a call once they are reviewed by us .   If we ordered any referrals today, please let us  know if you have not heard from their office within the next week.   If you had any urgent prescriptions sent in today, please check with the pharmacy within an hour of our visit to make sure the prescription was transmitted appropriately.   Please try these tips to maintain a healthy lifestyle:  Eat at least 3 REAL meals and 1-2 snacks per day.  Aim for no more than 5 hours between eating.  If you eat breakfast, please do so within one hour of getting up.   Each meal should contain half fruits/vegetables, one quarter protein, and one quarter carbs (no bigger than a computer mouse)  Cut down on sweet beverages. This includes juice, soda, and sweet tea.   Drink at least 1 glass of water with each meal and aim for at least 8 glasses per day  Exercise at least 150 minutes every week.

## 2023-08-17 ENCOUNTER — Other Ambulatory Visit: Payer: Self-pay | Admitting: Family Medicine

## 2023-08-18 ENCOUNTER — Ambulatory Visit: Payer: Medicare Other | Admitting: Family Medicine

## 2023-08-18 ENCOUNTER — Encounter: Payer: Self-pay | Admitting: Family Medicine

## 2023-08-18 VITALS — BP 153/86 | HR 94 | Temp 97.7°F | Ht 64.0 in | Wt 145.4 lb

## 2023-08-18 DIAGNOSIS — R051 Acute cough: Secondary | ICD-10-CM

## 2023-08-18 DIAGNOSIS — J0100 Acute maxillary sinusitis, unspecified: Secondary | ICD-10-CM | POA: Diagnosis not present

## 2023-08-18 DIAGNOSIS — R6889 Other general symptoms and signs: Secondary | ICD-10-CM

## 2023-08-18 LAB — POCT INFLUENZA A/B
Influenza A, POC: NEGATIVE
Influenza B, POC: NEGATIVE

## 2023-08-18 LAB — POC COVID19 BINAXNOW: SARS Coronavirus 2 Ag: POSITIVE — AB

## 2023-08-18 MED ORDER — CEFDINIR 300 MG PO CAPS: 300.0000 mg | ORAL_CAPSULE | Freq: Two times a day (BID) | ORAL | 0 refills | Status: DC

## 2023-08-18 MED ORDER — PREDNISONE 20 MG PO TABS
40.0000 mg | ORAL_TABLET | Freq: Every day | ORAL | 0 refills | Status: AC
Start: 2023-08-18 — End: 2023-08-23

## 2023-08-18 NOTE — Progress Notes (Signed)
 Subjective:     Patient ID: Natalie [name], female    DOB: March 16, 1946, 78 y.o.   MRN: [id number]  Chief Complaint  Patient presents with   Fever    Not currently.   Dental Pain   Headache   Pain behind eyes   Sore Throat    Slightly. All symptoms started Sunday.    Slight cough    Occasional, producing greenish mucus.    HPI Discussed the use of AI scribe software for clinical note transcription with the patient, who gave verbal consent to proceed.  History of Present Illness   Natalie [name] is a 78 year old female with sinus infections and allergies who presents with acute sinus pressure and headache.  She has been experiencing symptoms since Sunday, February 2nd, which began suddenly. Her symptoms include a headache, pressure in her ears, and pain in her teeth and eyes. She also has a slight fever and body aches. She denies shortness of breath but reports a sore throat and significant sinus pressure, particularly around her eyes. She has been coughing and sneezing with green sputum for approximately two weeks, but the intense sinus pressure only started yesterday.  of note, has been sneezing and congested for 2 wks.  She has a history of sinus infections and allergies but notes that her current symptoms are atypical for her. She has not been in contact with anyone who is sick recently and has not received the RSV vaccine.  She takes Zyrtec at night for her allergies and has no known allergies to antibiotics. Benadryl causes drowsiness due to her thyroid  disease. She has received the flu shot this year and has never had the flu or COVID-19.  Her blood pressure was high during her last visit and has been gradually increasing over the last few visits. She also has a history of shoulder pain, which she mentioned during a visit a couple of weeks ago.       Health Maintenance Due  Topic Date Due   Fecal DNA (Cologuard)  Never done   Zoster Vaccines- Shingrix (1 of 2) Never  done   DTaP/Tdap/Td (3 - Tdap) 04/18/2019   Medicare Annual Wellness (AWV)  10/06/2023    Past Medical History:  Diagnosis Date   Thyroid  disease    Goiter    Past Surgical History:  Procedure Laterality Date   BREAST EXCISIONAL BIOPSY Right    BREAST SURGERY     right lump   CATARACT EXTRACTION, BILATERAL     Dr Camillo   thyroid      TUBAL LIGATION       Current Outpatient Medications:    atorvastatin  (LIPITOR) 40 MG tablet, Take 1 tablet (40 mg total) by mouth daily., Disp: 30 tablet, Rfl: 1   cefdinir  (OMNICEF ) 300 MG capsule, Take 1 capsule (300 mg total) by mouth 2 (two) times daily., Disp: 14 capsule, Rfl: 0   cetirizine (ZYRTEC) 10 MG tablet, Take 10 mg by mouth daily., Disp: , Rfl:    meloxicam  (MOBIC ) 15 MG tablet, Take 1 tablet (15 mg total) by mouth daily., Disp: 30 tablet, Rfl: 0   metoprolol  succinate (TOPROL -XL) 25 MG 24 hr tablet, Take 1 tablet (25 mg total) by mouth daily., Disp: 90 tablet, Rfl: 3   predniSONE  (DELTASONE ) 20 MG tablet, Take 2 tablets (40 mg total) by mouth daily with breakfast for 5 days., Disp: 10 tablet, Rfl: 0   promethazine -dextromethorphan (PROMETHAZINE -DM) 6.25-15 MG/5ML syrup, TAKE 5 ML BY MOUTH  4 TIMES DAILY AS NEEDED, Disp: 118 mL, Rfl: 0   SUMAtriptan  (IMITREX ) 25 MG tablet, TAKE 1 TABLET BY MOUTH AS NEEDED FOR MIGRAINE. MAY REPEAT IN 2 HOURS IF HEADACHE PERSISTS OR RECURS, Disp: 10 tablet, Rfl: 0   SYNTHROID  75 MCG tablet, Take 1 tablet (75 mcg total) by mouth daily before breakfast., Disp: 90 tablet, Rfl: 3  No Known Allergies ROS neg/noncontributory except as noted HPI/below      Objective:     BP (!) 153/86   Pulse 94   Temp 97.7 F (36.5 C) (Temporal)   Ht 5' 4 (1.626 m)   Wt 145 lb 6.4 oz (66 kg)   SpO2 97%   BMI 24.96 kg/m  Wt Readings from Last 3 Encounters:  08/18/23 145 lb 6.4 oz (66 kg)  07/29/23 146 lb 12.8 oz (66.6 kg)  05/18/23 146 lb 12.8 oz (66.6 kg)    Physical Exam   Gen: WDWN mildly ill HEENT:  NCAT, conjunctiva not injected, sclera nonicteric TM WNL B, OP moist, no exudates.  + pan sinus TT percussion NECK:  supple, no thyromegaly, some submand nodes CARDIAC: RRR, S1S2+, no murmur.  LUNGS: CTAB. No wheezes EXT:  no edema MSK: no gross abnormalities.  NEURO: A&O x3.  CN II-XII intact.  PSYCH: normal mood. Good eye contact  Results for orders placed or performed in visit on 08/18/23  POC COVID-19   Collection Time: 08/18/23  2:01 PM  Result Value Ref Range   SARS Coronavirus 2 Ag Positive (A) Negative  POCT Influenza A/B   Collection Time: 08/18/23  2:01 PM  Result Value Ref Range   Influenza A, POC Negative Negative   Influenza B, POC Negative Negative        Assessment & Plan:  Acute non-recurrent maxillary sinusitis  Flu-like symptoms -     POCT Influenza A/B  Acute cough -     POC COVID-19 BinaxNow  Other orders -     predniSONE ; Take 2 tablets (40 mg total) by mouth daily with breakfast for 5 days.  Dispense: 10 tablet; Refill: 0 -     Cefdinir ; Take 1 capsule (300 mg total) by mouth 2 (two) times daily.  Dispense: 14 capsule; Refill: 0  Assessment and Plan    Sinusitis Acute sinus pressure, headache, ear pressure, and eye pain have been present since February 2nd, accompanied by sore throat, mild fever, body aches, and green sputum for the past couple of weeks. Differential diagnosis included sinus infection, allergies, common cold, flu, and COVID-19, with negative flu and COVID-19 tests- but very faint, hard to see possibly + covid. Sinusitis is the most likely diagnosis due to symptom duration and green sputum. Prescribe Omnicef  (cefdinir ) and ensure the full course is completed. Prescribe prednisone  40 mg daily for 5 days to relieve sinus pressure, with the option to stop if symptoms improve. Continue Zyrtec at night, encourage hydration, and use Mucinex if needed. Advise rechecking COVID-19 with a home test if symptoms persist.  Hypertension Blood pressure  readings were elevated at 153/94 mmHg. It is unclear if this is due to acute illness or an emerging chronic issue. Emphasize the importance of monitoring blood pressure at home and maintaining a log for further evaluation. Schedule a follow-up appointment with Dr. Kennyth for further assessment and management.  General Health Maintenance Discussed the importance of RSV vaccination due to age and potential exposure risks. The flu shot has been received, but not the RSV vaccine. Consider RSV vaccination in  the future and encourage scheduling a physical exam with Dr. Kennyth.  Follow-up Schedule a physical exam with Dr. Kennyth. Monitor blood pressure at home and bring the log to the next appointment.     Covid is so faintly + hard to see.  D/w pt.  She will do home test tomorrow   Return if symptoms worsen or fail to improve.  Jenkins CHRISTELLA Carrel, MD

## 2023-08-18 NOTE — Patient Instructions (Signed)
Don't have to finish the prednisone

## 2023-08-19 ENCOUNTER — Telehealth: Payer: Self-pay | Admitting: *Deleted

## 2023-08-19 NOTE — Telephone Encounter (Signed)
 Copied from CRM [id number]. Topic: Clinical - Medical Advice >> Aug 19, 2023  2:38 PM Thersia BROCKS wrote: Reason for CRM: Patient called in stating she tested positive today, wanted to know if she needs to just keep with what she was told to do yesterday or do something different is requesting a callback regarding this

## 2023-08-19 NOTE — Telephone Encounter (Signed)
 Dr. Waldo Guitar, please see message from patient.

## 2023-08-31 ENCOUNTER — Other Ambulatory Visit: Payer: Self-pay | Admitting: Family Medicine

## 2023-09-06 ENCOUNTER — Other Ambulatory Visit: Payer: Self-pay | Admitting: Family Medicine

## 2023-10-13 ENCOUNTER — Ambulatory Visit (INDEPENDENT_AMBULATORY_CARE_PROVIDER_SITE_OTHER): Admitting: Family Medicine

## 2023-10-13 ENCOUNTER — Encounter: Payer: Self-pay | Admitting: Family Medicine

## 2023-10-13 VITALS — BP 125/76 | HR 74 | Temp 97.0°F | Ht 64.0 in | Wt 143.0 lb

## 2023-10-13 DIAGNOSIS — E039 Hypothyroidism, unspecified: Secondary | ICD-10-CM

## 2023-10-13 DIAGNOSIS — R739 Hyperglycemia, unspecified: Secondary | ICD-10-CM

## 2023-10-13 DIAGNOSIS — E785 Hyperlipidemia, unspecified: Secondary | ICD-10-CM

## 2023-10-13 DIAGNOSIS — M810 Age-related osteoporosis without current pathological fracture: Secondary | ICD-10-CM

## 2023-10-13 DIAGNOSIS — E538 Deficiency of other specified B group vitamins: Secondary | ICD-10-CM | POA: Diagnosis not present

## 2023-10-13 DIAGNOSIS — Z0001 Encounter for general adult medical examination with abnormal findings: Secondary | ICD-10-CM | POA: Diagnosis not present

## 2023-10-13 DIAGNOSIS — E559 Vitamin D deficiency, unspecified: Secondary | ICD-10-CM

## 2023-10-13 DIAGNOSIS — G43101 Migraine with aura, not intractable, with status migrainosus: Secondary | ICD-10-CM

## 2023-10-13 LAB — LIPID PANEL
Cholesterol: 207 mg/dL — ABNORMAL HIGH (ref 0–200)
HDL: 43.7 mg/dL (ref >39.00)
LDL Cholesterol: 124 mg/dL — ABNORMAL HIGH (ref 0–99)
NonHDL: 163.51
Total CHOL/HDL Ratio: 5
Triglycerides: 199 mg/dL — ABNORMAL HIGH (ref 0.0–149.0)
VLDL: 39.8 mg/dL (ref 0.0–40.0)

## 2023-10-13 LAB — COMPREHENSIVE METABOLIC PANEL WITH GFR
ALT: 15 U/L (ref 0–35)
AST: 15 U/L (ref 0–37)
Albumin: 4 g/dL (ref 3.5–5.2)
Alkaline Phosphatase: 90 U/L (ref 39–117)
BUN: 12 mg/dL (ref 6–23)
CO2: 25 meq/L (ref 19–32)
Calcium: 9.1 mg/dL (ref 8.4–10.5)
Chloride: 102 meq/L (ref 96–112)
Creatinine, Ser: 0.79 mg/dL (ref 0.40–1.20)
GFR: 72.84 mL/min (ref >60.00)
Glucose, Bld: 100 mg/dL — ABNORMAL HIGH (ref 70–99)
Potassium: 4.2 meq/L (ref 3.5–5.1)
Sodium: 137 meq/L (ref 135–145)
Total Bilirubin: 0.4 mg/dL (ref 0.2–1.2)
Total Protein: 7.3 g/dL (ref 6.0–8.3)

## 2023-10-13 LAB — CBC
HCT: 38.3 % (ref 36.0–46.0)
Hemoglobin: 12.9 g/dL (ref 12.0–15.0)
MCHC: 33.7 g/dL (ref 30.0–36.0)
MCV: 91.3 fl (ref 78.0–100.0)
Platelets: 266 10*3/uL (ref 150.0–400.0)
RBC: 4.19 Mil/uL (ref 3.87–5.11)
RDW: 13.9 % (ref 11.5–15.5)
WBC: 9.1 10*3/uL (ref 4.0–10.5)

## 2023-10-13 LAB — VITAMIN D 25 HYDROXY (VIT D DEFICIENCY, FRACTURES): VITD: 28.34 ng/mL — ABNORMAL LOW (ref 30.00–100.00)

## 2023-10-13 LAB — TSH: TSH: 0.42 u[IU]/mL (ref 0.35–5.50)

## 2023-10-13 LAB — HEMOGLOBIN A1C: Hgb A1c MFr Bld: 6 % (ref 4.6–6.5)

## 2023-10-13 LAB — VITAMIN B12: Vitamin B-12: 245 pg/mL (ref 211–911)

## 2023-10-13 MED ORDER — SUMATRIPTAN SUCCINATE 25 MG PO TABS: ORAL_TABLET | ORAL | 0 refills | Status: AC

## 2023-10-13 MED ORDER — MELOXICAM 15 MG PO TABS: 15.0000 mg | ORAL_TABLET | Freq: Every day | ORAL | 0 refills | Status: AC

## 2023-10-13 MED ORDER — METOPROLOL SUCCINATE ER 25 MG PO TB24: 25.0000 mg | ORAL_TABLET | Freq: Every day | ORAL | 3 refills | Status: AC

## 2023-10-13 MED ORDER — SYNTHROID 75 MCG PO TABS: 75.0000 ug | ORAL_TABLET | Freq: Every day | ORAL | 3 refills | Status: AC

## 2023-10-13 NOTE — Assessment & Plan Note (Signed)
 She has seen endocrinology for this.  Due for DEXA later this year.  She is on vitamin D supplementation.  Check labs today.

## 2023-10-13 NOTE — Assessment & Plan Note (Signed)
 No recent flares.  Uses Imitrex as needed.  Also metoprolol 25 mg daily as prophylaxis.

## 2023-10-13 NOTE — Patient Instructions (Signed)
 It was very nice to see you today!  We will check blood work.  Please continue to work on diet and exercise.  I will refill your medications.  You can get the shingles vaccine at the pharmacy.  I will see back in year for your next physical.  Please come back sooner if needed.  Return in about 1 year (around 10/12/2024) for Annual Physical.   Take care, Dr Jimmey Ralph  PLEASE NOTE:  If you had any lab tests, please let us know if you have not heard back within a few days. You may see your results on mychart before we have a chance to review them but we will give you a call once they are reviewed by [place].   If we ordered any referrals today, please let us know if you have not heard from their office within the next week.   If you had any urgent prescriptions sent in today, please check with the pharmacy within an hour of our visit to make sure the prescription was transmitted appropriately.   Please try these tips to maintain a healthy lifestyle:  Eat at least 3 REAL meals and 1-2 snacks per day.  Aim for no more than 5 hours between eating.  If you eat breakfast, please do so within one hour of getting up.   Each meal should contain half fruits/vegetables, one quarter protein, and one quarter carbs (no bigger than a computer mouse)  Cut down on sweet beverages. This includes juice, soda, and sweet tea.   Drink at least 1 glass of water with each meal and aim for at least 8 glasses per day  Exercise at least 150 minutes every week.    Preventive Care 78 Years and Older, Female Preventive care refers to lifestyle choices and visits with your health care provider that can promote health and wellness. Preventive care visits are also called wellness exams. What can I expect for my preventive care visit? Counseling Your health care provider may ask you questions about your: Medical history, including: Past medical problems. Family medical history. Pregnancy and menstrual history. History  of falls. Current health, including: Memory and ability to understand (cognition). Emotional well-being. Home life and relationship well-being. Sexual activity and sexual health. Lifestyle, including: Alcohol, nicotine or tobacco, and drug use. Access to firearms. Diet, exercise, and sleep habits. Work and work Astronomer. Sunscreen use. Safety issues such as seatbelt and bike helmet use. Physical exam Your health care provider will check your: Height and weight. These may be used to calculate your BMI (body mass index). BMI is a measurement that tells if you are at a healthy weight. Waist circumference. This measures the distance around your waistline. This measurement also tells if you are at a healthy weight and may help predict your risk of certain diseases, such as type 2 diabetes and high blood pressure. Heart rate and blood pressure. Body temperature. Skin for abnormal spots. What immunizations do I need?  Vaccines are usually given at various ages, according to a schedule. Your health care provider will recommend vaccines for you based on your age, medical history, and lifestyle or other factors, such as travel or where you work. What tests do I need? Screening Your health care provider may recommend screening tests for certain conditions. This may include: Lipid and cholesterol levels. Hepatitis C test. Hepatitis B test. HIV (human immunodeficiency virus) test. STI (sexually transmitted infection) testing, if you are at risk. Lung cancer screening. Colorectal cancer screening. Diabetes screening. This  is done by checking your blood sugar (glucose) after you have not eaten for a while (fasting). Mammogram. Talk with your health care provider about how often you should have regular mammograms. BRCA-related cancer screening. This may be done if you have a family history of breast, ovarian, tubal, or peritoneal cancers. Bone density scan. This is done to screen for  osteoporosis. Talk with your health care provider about your test results, treatment options, and if necessary, the need for more tests. Follow these instructions at home: Eating and drinking  Eat a diet that includes fresh fruits and vegetables, whole grains, lean protein, and low-fat dairy products. Limit your intake of foods with high amounts of sugar, saturated fats, and salt. Take vitamin and mineral supplements as recommended by your health care provider. Do not drink alcohol if your health care provider tells you not to drink. If you drink alcohol: Limit how much you have to 0-1 drink a day. Know how much alcohol is in your drink. In the [place] one drink equals one 12 oz bottle of beer (355 mL), one 5 oz glass of wine (148 mL), or one 1 oz glass of hard liquor (44 mL). Lifestyle Brush your teeth every morning and night with fluoride toothpaste. Floss one time each day. Exercise for at least 30 minutes 5 or more days each week. Do not use any products that contain nicotine or tobacco. These products include cigarettes, chewing tobacco, and vaping devices, such as e-cigarettes. If you need help quitting, ask your health care provider. Do not use drugs. If you are sexually active, practice safe sex. Use a condom or other form of protection in order to prevent STIs. Take aspirin only as told by your health care provider. Make sure that you understand how much to take and what form to take. Work with your health care provider to find out whether it is safe and beneficial for you to take aspirin daily. Ask your health care provider if you need to take a cholesterol-lowering medicine (statin). Find healthy ways to manage stress, such as: Meditation, yoga, or listening to music. Journaling. Talking to a trusted person. Spending time with friends and family. Minimize exposure to UV radiation to reduce your risk of skin cancer. Safety Always wear your seat belt while driving or riding in a  vehicle. Do not drive: If you have been drinking alcohol. Do not ride with someone who has been drinking. When you are tired or distracted. While texting. If you have been using any mind-altering substances or drugs. Wear a helmet and other protective equipment during sports activities. If you have firearms in your house, make sure you follow all gun safety procedures. What's next? Visit your health care provider once a year for an annual wellness visit. Ask your health care provider how often you should have your eyes and teeth checked. Stay up to date on all vaccines. This information is not intended to replace advice given to you by your health care provider. Make sure you discuss any questions you have with your health care provider. Document Revised: 12/26/2020 Document Reviewed: 12/26/2020 Elsevier Patient Education  2024 [employer].

## 2023-10-13 NOTE — Assessment & Plan Note (Signed)
Check TSH.  She is on Synthroid 75 mcg daily.

## 2023-10-13 NOTE — Progress Notes (Signed)
 Chief Complaint:  Natalie [name] is a 78 y.o. female who presents today for her annual comprehensive physical exam.    Assessment/Plan:  Chronic Problems Addressed Today: Vitamin D deficiency Check Vitamin D.   Vitamin B12 deficiency Check B12.  Hyperglycemia Check A1c.  Discussed lifestyle modifications.  Dyslipidemia Check lipids cannot tolerate statin due to myalgias.  She is working on lifestyle interventions.  Hypothyroidism Check TSH.  She is on Synthroid 75 mcg daily.  Osteoporosis She has seen endocrinology for this.  Due for DEXA later this year.  She is on vitamin D supplementation.  Check labs today.  Migraine headache with aura No recent flares.  Uses Imitrex as needed.  Also metoprolol 25 mg daily as prophylaxis.  Preventative Healthcare: Check labs. Will order DEXA. She can get shingles at the pharmacy.   Patient Counseling(The following topics were reviewed and/or handout was given):  -Nutrition: Stressed importance of moderation in sodium/caffeine intake, saturated fat and cholesterol, caloric balance, sufficient intake of fresh fruits, vegetables, and fiber.  -Stressed the importance of regular exercise.   -Substance Abuse: Discussed cessation/primary prevention of tobacco, alcohol, or other drug use; driving or other dangerous activities under the influence; availability of treatment for abuse.   -Injury prevention: Discussed safety belts, safety helmets, smoke detector, smoking near bedding or upholstery.   -Sexuality: Discussed sexually transmitted diseases, partner selection, use of condoms, avoidance of unintended pregnancy and contraceptive alternatives.   -Dental health: Discussed importance of regular tooth brushing, flossing, and dental visits.  -Health maintenance and immunizations reviewed. Please refer to Health maintenance section.  Return to care in 1 year for next preventative visit.     Subjective:  HPI:  She has no acute  complaints today. See Assessment / plan for status of chronic conditions.  She did have COVID since her last visit.  She has recovered from this.  Lifestyle Diet: Balanced. Plenty of fruits and vegetables.  Exercise: Dancing and walking.      10/13/2023    8:07 AM  Depression screen PHQ 2/9  Decreased Interest 0  Down, Depressed, Hopeless 0  PHQ - 2 Score 0    Health Maintenance Due  Topic Date Due   Medicare Annual Wellness (AWV)  10/06/2023     ROS: Per HPI, otherwise a complete review of systems was negative.   PMH:  The following were reviewed and entered/updated in epic: Past Medical History:  Diagnosis Date   Thyroid disease    Goiter   Patient Active Problem List   Diagnosis Date Noted   Thyromegaly 05/18/2023   Osteoarthritis 11/14/2021   Varicose veins of both lower extremities with inflammation 04/18/2021   Vitamin D deficiency 08/13/2020   Vitamin B12 deficiency 08/13/2020   Hyperglycemia 08/13/2020   Dyslipidemia 08/23/2018   Migraine headache with aura 03/30/2014   Tachycardia 12/12/2011   Hypothyroidism 04/14/2007   Allergic rhinitis 04/14/2007   Osteoporosis 04/14/2007   Past Surgical History:  Procedure Laterality Date   BREAST EXCISIONAL BIOPSY Right    BREAST SURGERY     right lump   CATARACT EXTRACTION, BILATERAL     Dr Hazle Quant   thyroid     TUBAL LIGATION      Family History  Problem Relation Age of Onset   Colon polyps Father 32   Arthritis Other    Cancer Other        colon, prostate   Heart disease Mother        CHF, died age 58  Lung cancer Brother    Stomach cancer Neg Hx     Medications- reviewed and updated Current Outpatient Medications  Medication Sig Dispense Refill   cetirizine (ZYRTEC) 10 MG tablet Take 10 mg by mouth daily.     meloxicam (MOBIC) 15 MG tablet Take 1 tablet (15 mg total) by mouth daily. 30 tablet 0   metoprolol succinate (TOPROL-XL) 25 MG 24 hr tablet Take 1 tablet (25 mg total) by mouth daily. 90  tablet 3   SUMAtriptan (IMITREX) 25 MG tablet TAKE 1 TABLET BY MOUTH AS NEEDED FOR MIGRAINE. MAY REPEAT IN 2 HOURS IF HEADACHE PERSISTS OR RECURS 10 tablet 0   SYNTHROID 75 MCG tablet Take 1 tablet (75 mcg total) by mouth daily before breakfast. 90 tablet 3   No current facility-administered medications for this visit.    Allergies-reviewed and updated No Known Allergies  Social History   Socioeconomic History   Marital status: Divorced    Spouse name: Not on file   Number of children: 2   Years of education: Not on file   Highest education level: Not on file  Occupational History   Not on file  Tobacco Use   Smoking status: Never   Smokeless tobacco: Never  Substance and Sexual Activity   Alcohol use: No   Drug use: No   Sexual activity: Not on file  Other Topics Concern   Not on file  Social History Narrative   Lives with husband, son and grandson.   Social Drivers of Corporate investment banker Strain: Low Risk  (09/12/2021)   Overall Financial Resource Strain (CARDIA)    Difficulty of Paying Living Expenses: Not hard at all  Food Insecurity: No Food Insecurity (10/06/2022)   Hunger Vital Sign    Worried About Running Out of Food in the Last Year: Never true    Ran Out of Food in the Last Year: Never true  Transportation Needs: No Transportation Needs (10/06/2022)   PRAPARE - Administrator, Civil Service (Medical): No    Lack of Transportation (Non-Medical): No  Physical Activity: Inactive (10/06/2022)   Exercise Vital Sign    Days of Exercise per Week: 0 days    Minutes of Exercise per Session: 0 min  Stress: No Stress Concern Present (10/06/2022)   [employer] of Occupational Health - Occupational Stress Questionnaire    Feeling of Stress : Not at all  Social Connections: Moderately Isolated (10/06/2022)   Social Connection and Isolation Panel [NHANES]    Frequency of Communication with Friends and Family: Once a week    Frequency of Social  Gatherings with Friends and Family: More than three times a week    Attends Religious Services: 1 to 4 times per year    Active Member of [employer] or Organizations: No    Attends Banker Meetings: Never    Marital Status: Divorced        Objective:  Physical Exam: BP 125/76   Pulse 74   Temp (!) 97 F (36.1 C) (Temporal)   Ht 5\' 4"  (1.626 m)   Wt 143 lb (64.9 kg)   SpO2 98%   BMI 24.55 kg/m   Body mass index is 24.55 kg/m. Wt Readings from Last 3 Encounters:  10/13/23 143 lb (64.9 kg)  08/18/23 145 lb 6.4 oz (66 kg)  07/29/23 146 lb 12.8 oz (66.6 kg)   Gen: NAD, resting comfortably HEENT: TMs normal bilaterally. OP clear. No thyromegaly noted.  CV:  RRR with no murmurs appreciated Pulm: NWOB, CTAB with no crackles, wheezes, or rhonchi GI: Normal bowel sounds present. Soft, Nontender, Nondistended. MSK: no edema, cyanosis, or clubbing noted Skin: warm, dry Neuro: CN2-12 grossly intact. Strength 5/5 in upper and lower extremities. Reflexes symmetric and intact bilaterally.  Psych: Normal affect and thought content     Aziah Kaiser M. Jimmey Ralph, MD 10/13/2023 8:29 AM

## 2023-10-13 NOTE — Assessment & Plan Note (Signed)
 Check A1c.  Discussed lifestyle modifications.

## 2023-10-13 NOTE — Assessment & Plan Note (Signed)
 Check B12

## 2023-10-13 NOTE — Assessment & Plan Note (Signed)
 Check lipids cannot tolerate statin due to myalgias.  She is working on lifestyle interventions.

## 2023-10-13 NOTE — Assessment & Plan Note (Signed)
 Check Vitamin D

## 2023-10-14 ENCOUNTER — Encounter: Payer: Self-pay | Admitting: Family Medicine

## 2023-10-14 NOTE — Progress Notes (Signed)
 Vitamin D is still little bit low.  Recommend she take 5000 IUs vitamin D daily.  We should recheck in 3 to 6 months.  Her blood sugar is borderline elevated.  Do not need to start meds for this however she should work on diet and exercise and we can repeat check in a year.  Cholesterol is better than last time we checked.  I know she could not tolerate the statin previously.  We can try a nonstatin medication if she wishes.  Please send in Zetia 10 mg daily if she is agreeable.  Regardless, she should continue to work on diet and exercise and we can recheck this again in 6 to 12 months.  The rest of her labs are all at goal and we can recheck next year.

## 2023-11-12 DIAGNOSIS — H0288B Meibomian gland dysfunction left eye, upper and lower eyelids: Secondary | ICD-10-CM | POA: Diagnosis not present

## 2023-11-12 DIAGNOSIS — H26493 Other secondary cataract, bilateral: Secondary | ICD-10-CM | POA: Diagnosis not present

## 2023-11-12 DIAGNOSIS — H04222 Epiphora due to insufficient drainage, left lacrimal gland: Secondary | ICD-10-CM | POA: Diagnosis not present

## 2023-11-12 DIAGNOSIS — H0288A Meibomian gland dysfunction right eye, upper and lower eyelids: Secondary | ICD-10-CM | POA: Diagnosis not present

## 2023-11-23 ENCOUNTER — Encounter: Payer: Medicare Other | Admitting: Family Medicine

## 2023-12-14 DIAGNOSIS — K080 Exfoliation of teeth due to systemic causes: Secondary | ICD-10-CM | POA: Diagnosis not present

## 2023-12-16 DIAGNOSIS — Z411 Encounter for cosmetic surgery: Secondary | ICD-10-CM | POA: Diagnosis not present

## 2023-12-16 DIAGNOSIS — L309 Dermatitis, unspecified: Secondary | ICD-10-CM | POA: Diagnosis not present

## 2023-12-28 DIAGNOSIS — L309 Dermatitis, unspecified: Secondary | ICD-10-CM | POA: Diagnosis not present

## 2024-02-19 ENCOUNTER — Other Ambulatory Visit: Payer: Self-pay | Admitting: Family Medicine

## 2024-02-19 DIAGNOSIS — Z1231 Encounter for screening mammogram for malignant neoplasm of breast: Secondary | ICD-10-CM

## 2024-03-02 ENCOUNTER — Ambulatory Visit
Admission: RE | Admit: 2024-03-02 | Discharge: 2024-03-02 | Disposition: A | Discharge status: Home / Self Care | Source: Ambulatory Visit | Attending: Family Medicine | Admitting: Family Medicine

## 2024-03-02 DIAGNOSIS — Z1231 Encounter for screening mammogram for malignant neoplasm of breast: Secondary | ICD-10-CM | POA: Diagnosis not present

## 2024-03-07 DIAGNOSIS — B354 Tinea corporis: Secondary | ICD-10-CM | POA: Diagnosis not present

## 2024-03-31 DIAGNOSIS — L309 Dermatitis, unspecified: Secondary | ICD-10-CM | POA: Diagnosis not present

## 2024-05-10 ENCOUNTER — Ambulatory Visit (INDEPENDENT_AMBULATORY_CARE_PROVIDER_SITE_OTHER)

## 2024-05-10 DIAGNOSIS — Z23 Encounter for immunization: Secondary | ICD-10-CM

## 2024-05-10 NOTE — Progress Notes (Signed)
 Flu vaccine given IM L del. Pt tolerated well

## 2024-05-18 DIAGNOSIS — Z01419 Encounter for gynecological examination (general) (routine) without abnormal findings: Secondary | ICD-10-CM | POA: Diagnosis not present

## 2024-05-19 LAB — LAB REPORT - SCANNED
Free T4: 1.5 ng/dL
TSH: 0.25 — AB (ref 0.41–5.90)

## 2024-05-24 ENCOUNTER — Telehealth: Payer: Self-pay | Admitting: *Deleted

## 2024-05-24 NOTE — Telephone Encounter (Signed)
 Copied from CRM [id number]. Topic: General - Other >> May 23, 2024  2:32 PM Natalie [name] wrote: Reason for CRM: Pt stated that she has some thyroid  concerns due to her labs showing low thyroid  results. Pt wants to know if she needs to come in for an appt or if she can get the dose increased on the thyroid  medication.   LVM to return call [facility]

## 2024-05-24 NOTE — Telephone Encounter (Signed)
 Spoke with patient, OV schedule with PCP

## 2024-05-26 ENCOUNTER — Ambulatory Visit: Admitting: Family Medicine

## 2024-05-26 VITALS — BP 122/70 | HR 67 | Temp 97.2°F | Ht 64.0 in | Wt 147.0 lb

## 2024-05-26 DIAGNOSIS — E039 Hypothyroidism, unspecified: Secondary | ICD-10-CM | POA: Diagnosis not present

## 2024-05-26 DIAGNOSIS — M810 Age-related osteoporosis without current pathological fracture: Secondary | ICD-10-CM | POA: Diagnosis not present

## 2024-05-26 DIAGNOSIS — E538 Deficiency of other specified B group vitamins: Secondary | ICD-10-CM

## 2024-05-26 DIAGNOSIS — E559 Vitamin D deficiency, unspecified: Secondary | ICD-10-CM

## 2024-05-26 NOTE — Assessment & Plan Note (Signed)
 Most recent TSH slightly below goal.  She has been on Synthroid  75 mcg daily for many years.  Recheck TSH, free T4, and free T3.

## 2024-05-26 NOTE — Assessment & Plan Note (Signed)
 Recent B12 level at goal

## 2024-05-26 NOTE — Patient Instructions (Addendum)
 It was very nice to see you today!  VISIT SUMMARY: Today, we discussed your concerns about low thyroid  levels, fatigue, and vitamin D  deficiency. We also reviewed your history of osteoporosis and recent stress due to personal losses.  YOUR PLAN: HYPOTHYROIDISM: Your thyroid  levels are low, which might be due to over-treatment and could affect your bone density. -We rechecked your TSH, free T3, and free T4 levels today. -Continue taking your current dose of thyroid  medication.  VITAMIN D  DEFICIENCY: Your vitamin D  levels are low, which might be contributing to your fatigue. -Increase your vitamin D  intake to 5000 IU daily. -We will recheck your vitamin D  levels at your next visit or during your follow-up with your gynecologist.  OSTEOPOROSIS: Your long-term thyroid  medication might be affecting your bone density. -We discussed Evenity as an alternative to Prolia for your osteoporosis treatment. Consider Evenity if needed.  GENERAL HEALTH MAINTENANCE: We talked about the stress and emotional support you need after your recent personal losses. -Continue with your scheduled bone density test next year. -We offered support and counseling if you need it.  Return if symptoms worsen or fail to improve.   Take care, Dr Kennyth  PLEASE NOTE:  If you had any lab tests, please let us  know if you have not heard back within a few days. You may see your results on mychart before we have a chance to review them but we will give you a call once they are reviewed by us .   If we ordered any referrals today, please let us  know if you have not heard from their office within the next week.   If you had any urgent prescriptions sent in today, please check with the pharmacy within an hour of our visit to make sure the prescription was transmitted appropriately.   Please try these tips to maintain a healthy lifestyle:  Eat at least 3 REAL meals and 1-2 snacks per day.  Aim for no more than 5 hours between  eating.  If you eat breakfast, please do so within one hour of getting up.   Each meal should contain half fruits/vegetables, one quarter protein, and one quarter carbs (no bigger than a computer mouse)  Cut down on sweet beverages. This includes juice, soda, and sweet tea.   Drink at least 1 glass of water with each meal and aim for at least 8 glasses per day  Exercise at least 150 minutes every week.

## 2024-05-26 NOTE — Assessment & Plan Note (Signed)
 Recently was told by gynecology that her vitamin D  levels were low.  Recommended increase vitamin D  to 4 to 5000 IUs daily.  We can recheck next blood draw here.

## 2024-05-26 NOTE — Assessment & Plan Note (Signed)
 On calcium  and vitamin D  supplementation.  She previously completed a course of Fosamax and has been following with endocrinology though does not want to go back as she is not interested in starting [place] or Prolia at this point.  She will let us  know if she changes her mind.

## 2024-05-26 NOTE — Progress Notes (Signed)
   Natalie [name] is a 78 y.o. female who presents today for an office visit.  Assessment/Plan:  Chronic Problems Addressed Today: Vitamin D  deficiency Recently was told by gynecology that her vitamin D  levels were low.  Recommended increase vitamin D  to 4 to 5000 IUs daily.  We can recheck next blood draw here.  Hypothyroidism Most recent TSH slightly below goal.  She has been on Synthroid  75 mcg daily for many years.  Recheck TSH, free T4, and free T3.  Vitamin B12 deficiency Recent B12 level at goal  Osteoporosis On calcium  and vitamin D  supplementation.  She previously completed a course of Fosamax and has been following with endocrinology though does not want to go back as she is not interested in starting [place] or Prolia at this point.  She will let us  know if she changes her mind.     Subjective:  HPI:  See assessment / plan for status of chronic conditions.   Discussed the use of AI scribe software for clinical note transcription with the patient, who gave verbal consent to proceed.  History of Present Illness Natalie [name] is a 78 year old female with a history of thyroid  issues and osteoporosis who presents with concerns about low thyroid  levels and fatigue.  She recently visited her gynecologist, where blood tests indicated low thyroid  levels and low vitamin D . Her thyroid  level was reported as 'below thirty.' She has been on thyroid  medication for over fifty years, with her current dose unchanged for a couple of years. She is concerned about weight gain and fatigue, stating she is gaining weight and feels tired. She also tends to fall asleep easily when sitting.  She takes 2000 IU of vitamin D  daily. Her vitamin D  was noted to be low, but she was not informed of the specific level. She recalls a previous check in February showed a level of twenty-eight. She has been experiencing fatigue.  She has been under significant stress recently due to the loss of a  sister and a pet, affecting her sleep. She describes having a supportive home environment but acknowledges the emotional toll of recent events.  She has a history of osteoporosis and has previously taken Fosamax for several years. She chose not to continue with Prolia due to concerns about side effects. She could not get a bone density test this year and will have to wait until next year.         Objective:  Physical Exam: BP 122/70   Pulse 67   Temp (!) 97.2 F (36.2 C) (Temporal)   Ht 5' 4 (1.626 m)   Wt 147 lb (66.7 kg)   SpO2 98%   BMI 25.23 kg/m   Gen: No acute distress, resting comfortably Neuro: Grossly normal, moves all extremities Psych: Normal affect and thought content      Natalie [name] M. Kennyth, MD 05/26/2024 3:14 PM

## 2024-05-27 LAB — T4, FREE: Free T4: 1.08 ng/dL (ref 0.60–1.60)

## 2024-05-27 LAB — TSH: TSH: 0.44 u[IU]/mL (ref 0.35–5.50)

## 2024-05-27 LAB — T3, FREE: T3, Free: 3 pg/mL (ref 2.3–4.2)

## 2024-05-31 ENCOUNTER — Ambulatory Visit: Payer: Self-pay | Admitting: Family Medicine

## 2024-05-31 NOTE — Progress Notes (Signed)
 Her thyroid  levels are at goal.  Do not recommend making any changes to her treatment plan.  We should recheck again in 6 to 12 months.

## 2024-06-20 DIAGNOSIS — K080 Exfoliation of teeth due to systemic causes: Secondary | ICD-10-CM | POA: Diagnosis not present

## 2024-09-14 ENCOUNTER — Ambulatory Visit (HOSPITAL_BASED_OUTPATIENT_CLINIC_OR_DEPARTMENT_OTHER)
